# Patient Record
Sex: Male | Born: 1990 | Race: White | Hispanic: No | Marital: Single | Smoking: Never smoker
Health system: Southern US, Community
[De-identification: ages and names within clinical notes are randomized; demographics above are authoritative.]

## PROBLEM LIST (undated history)

## (undated) DIAGNOSIS — J45909 Unspecified asthma, uncomplicated: Secondary | ICD-10-CM

## (undated) DIAGNOSIS — Z22322 Carrier or suspected carrier of Methicillin resistant Staphylococcus aureus: Secondary | ICD-10-CM

## (undated) DIAGNOSIS — I890 Lymphedema, not elsewhere classified: Secondary | ICD-10-CM

## (undated) DIAGNOSIS — R569 Unspecified convulsions: Secondary | ICD-10-CM

## (undated) DIAGNOSIS — L0390 Cellulitis, unspecified: Secondary | ICD-10-CM

## (undated) DIAGNOSIS — F410 Panic disorder [episodic paroxysmal anxiety] without agoraphobia: Secondary | ICD-10-CM

## (undated) DIAGNOSIS — I809 Phlebitis and thrombophlebitis of unspecified site: Secondary | ICD-10-CM

## (undated) HISTORY — PX: TONSILLECTOMY: SUR1361

## (undated) HISTORY — PX: CATARACT EXTRACTION: SUR2

## (undated) HISTORY — PX: TONSILLECTOMY AND ADENOIDECTOMY: SHX28

---
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
## 2009-03-16 ENCOUNTER — Emergency Department: Admit: 2009-03-16 | Payer: Self-pay | Source: Emergency Department

## 2009-07-29 ENCOUNTER — Emergency Department: Admit: 2009-07-29 | Payer: Self-pay | Source: Emergency Department | Admitting: Emergency Medicine

## 2011-09-21 ENCOUNTER — Emergency Department
Admission: EM | Admit: 2011-09-21 | Discharge: 2011-09-21 | Disposition: A | Discharge status: Home / Self Care | Payer: BC Managed Care – PPO

## 2011-09-21 ENCOUNTER — Emergency Department: Payer: BC Managed Care – PPO

## 2011-09-21 DIAGNOSIS — Z8614 Personal history of Methicillin resistant Staphylococcus aureus infection: Secondary | ICD-10-CM | POA: Insufficient documentation

## 2011-09-21 DIAGNOSIS — L0291 Cutaneous abscess, unspecified: Secondary | ICD-10-CM

## 2011-09-21 DIAGNOSIS — L02419 Cutaneous abscess of limb, unspecified: Secondary | ICD-10-CM | POA: Insufficient documentation

## 2011-09-21 DIAGNOSIS — L03119 Cellulitis of unspecified part of limb: Secondary | ICD-10-CM | POA: Insufficient documentation

## 2011-09-21 HISTORY — DX: Lymphedema, not elsewhere classified: I89.0

## 2011-09-21 HISTORY — DX: Unspecified asthma, uncomplicated: J45.909

## 2011-09-21 MED ORDER — HYDROCODONE-ACETAMINOPHEN 5-500 MG PO TABS
1.0000 | ORAL_TABLET | Freq: Three times a day (TID) | ORAL | Status: AC | PRN
Start: 2011-09-21 — End: ?

## 2011-09-21 MED ORDER — DOXYCYCLINE HYCLATE 100 MG PO TABS
100.00 mg | ORAL_TABLET | Freq: Two times a day (BID) | ORAL | Status: AC
Start: 2011-09-21 — End: 2011-09-24

## 2011-09-21 NOTE — Discharge Instructions (Signed)
Abscess    You were diagnosed with an abscess of the:   Skin.    An abscess is a sore that is infected and filled with pus. It is caused by an infection with bacteria. Sometimes a splinter or other object stuck in the skin can cause an infection that can become an abscess. The body traps the infection in a tight pocket to try to stop the infection from spreading to other areas. The usual treatment is to make a cut in the abscess so the pus can drain out. Most abscesses heal quickly with no need for antibiotics.    Your doctor has determined that antibiotics ARE necessary to treat your abscess. Fill the prescription and take all medications as prescribed until they are all gone.    A drain and/or packing have been placed in your abscess to help the wound heal. The packing in your wound will need to be changed. This can be done by your family doctor, a referral doctor, this facility or the nearest Emergency Department. Your physician will set up a plan for you to have the packing changed. Leave the drain and packing in place until you see a doctor. The drain might fall out on it's own. If this happens, cover the abscess with a clean dressing (bandage) and follow up with a doctor as scheduled. Until the packing is removed, don't soak the wound in water, like in a bathtub or pool. Short showers or sponge baths are okay. Keep the wound as dry and clean as possible.    YOU SHOULD SEEK MEDICAL ATTENTION IMMEDIATELY, EITHER HERE OR AT THE NEAREST EMERGENCY DEPARTMENT, IF ANY OF THE FOLLOWING OCCURS:   You see unusual redness or swelling.   You see red streaks on the arm or leg.   The wound or drainage smells bad.   You have fevers, chills, worse pain or swelling.

## 2011-09-21 NOTE — ED Provider Notes (Signed)
History   No chief complaint on file.    Patient is a 21 y.o. male presenting with abscess.   Abscess   The problem occurs occasionally. The problem has been unchanged. The abscess is present on the left upper leg. The problem is mild. The abscess is characterized by redness and draining (recurrent / has hx mrsa).       No past medical history on file.    No past surgical history on file.    No family history on file.    No current facility-administered medications for this encounter.     No current outpatient prescriptions on file.       Allergies not on file    History   Substance Use Topics   . Smoking status: Not on file   . Smokeless tobacco: Not on file   . Alcohol Use: Not on file       Review of Systems   Skin:        [Thigh abscess / draining  [all other systems reviewed and are negative        Physical Exam   There were no vitals taken for this visit.    Physical Exam   [nursing notereviewed.  Constitutional: He is oriented to person, place, and time. He appears well-developed and well-nourished.   Pulmonary/Chest: Wheezes: lt inner thigh small / draining abscess.   Musculoskeletal: He exhibits edema.   Neurological: He is alert and oriented to person, place, and time.   Skin: Skin is warm. There is erythema.        Lt inner thigh / draining abscess       ED Course   Procedures    MDM           Ok Anis, MD  09/21/11 1415

## 2011-09-21 NOTE — ED Notes (Signed)
Right inner thigh wound culture obtained by dr. Roseanne Kaufman and sent to lab

## 2011-09-21 NOTE — ED Notes (Signed)
History of MRSA was on Bactrim ineffective, onset 4 days ago found had right inner thigh boil infected, pus drainage, no fever

## 2011-09-24 NOTE — Progress Notes (Signed)
Quick Note:    Final Wound Culture - positive MRSA. Chart reviewed by Dr. Nedra Hai. Pt on appropriate antibiotic therapy. No further action required.  ______

## 2011-12-19 ENCOUNTER — Emergency Department
Admission: EM | Admit: 2011-12-19 | Discharge: 2011-12-19 | Disposition: A | Discharge status: Home / Self Care | Payer: BC Managed Care – PPO | Attending: Emergency Medicine | Admitting: Emergency Medicine

## 2011-12-19 ENCOUNTER — Emergency Department: Payer: BC Managed Care – PPO

## 2011-12-19 DIAGNOSIS — J45909 Unspecified asthma, uncomplicated: Secondary | ICD-10-CM | POA: Insufficient documentation

## 2011-12-19 DIAGNOSIS — L03116 Cellulitis of left lower limb: Secondary | ICD-10-CM

## 2011-12-19 DIAGNOSIS — L03119 Cellulitis of unspecified part of limb: Secondary | ICD-10-CM | POA: Insufficient documentation

## 2011-12-19 DIAGNOSIS — Z8614 Personal history of Methicillin resistant Staphylococcus aureus infection: Secondary | ICD-10-CM | POA: Insufficient documentation

## 2011-12-19 HISTORY — DX: Carrier or suspected carrier of methicillin resistant Staphylococcus aureus: Z22.322

## 2011-12-19 MED ORDER — SULFAMETHOXAZOLE-TRIMETHOPRIM 800-160 MG PO TABS
1.00 | ORAL_TABLET | Freq: Two times a day (BID) | ORAL | Status: AC
Start: 2011-12-19 — End: 2011-12-26

## 2011-12-19 NOTE — ED Notes (Signed)
Discharge instructions  Given to pt.with prescriptions.

## 2011-12-19 NOTE — ED Provider Notes (Signed)
Diagnosis:    1. Cellulitis of left foot          Disposition:  home          _________________________________  HPI:   Marcus Pineda is a 21 y.o. male presents with left foot erythema. Pt with hx of MRSA and chronic lymphedema and recurrent cellulitis. Pt denies fever    PMD:    ROS:    Documented in HPI.   Nursing notes reveiwed  Past Medical and Surgical History Reviewed      PE:  AF/VSR  CONSTITUTIONAL: Patient is afebrile, Vital signs reviewed, Alert and oriented X 3.   HEAD: Atraumatic, Normocephalic.   EYES: Eyes are normal to inspection, Pupils equal, round and reactive to light, Extraocular muscles intact.   ENT: Ears normal to inspection, Nose examination normal.   NECK: Normal ROM, No jugular venous distention.   BACK: There is no CVA Tenderness, There is no tenderness to palpation.   UPPER EXTREMITY: Inspection normal, No cyanosis, Shoulder exam shows.   LOWER EXTREMITY: Inspection normal, No cyanosis.   NEURO: GCS is 15, No focal motor deficits.   SKIN: dorsal left foot erythema, mild edema 4x4 cm area.   PSYCHIATRIC: Oriented X 3, Normal affect.            MDM: abx and f/u in 2 days      Georgina Peer, MD  _________________________________        No data found.      Past Medical History   Diagnosis Date   . Lymphatic edema    . Asthma without status asthmaticus    . Methicillin resistant Staph aureus culture positive        Past Surgical History   Procedure Date   . Tonsillectomy        No family history on file.      Labs:  Results     ** No Results found for the last 24 hours. Shara Blazing, MD  12/19/11 2203

## 2011-12-19 NOTE — ED Notes (Signed)
Presents with left foot  Cellulitis starting this evening

## 2011-12-19 NOTE — Discharge Instructions (Signed)
Cellulitis    You have been diagnosed with cellulitis.    This is a bacterial infection of the skin. Symptoms usually include redness, swelling, and warmth in the affected area. Some people will have a fever with this infection.    Elevate the extremity above your heart level if possible.    Treatment of cellulitis includes antibiotics and elevation of the affected area. Sometimes the antibiotics need to be given intravenously ("IV") while other infections can be treated with oral (by mouth) medications.    The redness, swelling, warmth, and fever should start to improve after 2-3 days of treatment. You should return here or go to the nearest Emergency Department, or see your primary care doctor for a recheck as directed.    Return here or go to the nearest Emergency Department in 48 hours for another examination.    YOU SHOULD SEEK MEDICAL ATTENTION IMMEDIATELY, EITHER HERE OR AT THE NEAREST EMERGENCY DEPARTMENT, IF ANY OF THE FOLLOWING OCCURS:   Spreading redness even with treatment. You may wish to mark the area of infection with a pen to better watch for improvement or spreading.   Increasing or continued fever after 2-3 days of antibiotics.   Unusual or increasing pain at the site of the infection.   Lightheadedness.   If feeling sicker at any time, or if not improving as expected.      [facility] Referral    [facility] Referral     You have been referred to the [facility] for your follow-up care. Please contact them for assistance at [phone] or [phone].  You can also find them online at:  [email or web address]

## 2012-07-12 ENCOUNTER — Emergency Department: Payer: BC Managed Care – PPO

## 2012-07-12 ENCOUNTER — Emergency Department
Admission: EM | Admit: 2012-07-12 | Discharge: 2012-07-12 | Disposition: A | Discharge status: Home / Self Care | Payer: BC Managed Care – PPO

## 2012-07-12 DIAGNOSIS — J45909 Unspecified asthma, uncomplicated: Secondary | ICD-10-CM | POA: Insufficient documentation

## 2012-07-12 DIAGNOSIS — L02419 Cutaneous abscess of limb, unspecified: Secondary | ICD-10-CM | POA: Insufficient documentation

## 2012-07-12 DIAGNOSIS — Z8614 Personal history of Methicillin resistant Staphylococcus aureus infection: Secondary | ICD-10-CM | POA: Insufficient documentation

## 2012-07-12 MED ORDER — SULFAMETHOXAZOLE-TRIMETHOPRIM 800-160 MG PO TABS
1.00 | ORAL_TABLET | Freq: Two times a day (BID) | ORAL | Status: AC
Start: 2012-07-12 — End: 2012-07-19

## 2012-07-12 NOTE — ED Notes (Signed)
Noticed 3 days ago cellulitis to both legs  Left worse than right  Patient prone to this due to lymphadema

## 2012-07-12 NOTE — ED Provider Notes (Signed)
Physician/Midlevel provider first contact with patient: 07/12/12 1414         History     Chief Complaint   Patient presents with   . Rash     Patient is a 21 y.o. male presenting with rash. The history is provided by the patient.   Rash   This is a recurrent problem. The problem has been gradually worsening (hx of mrsa). Associated with: lymphedema. There has been no fever. The rash is present on the left lower leg. The pain is at a severity of 3/10.       Past Medical History   Diagnosis Date   . Lymphatic edema    . Asthma without status asthmaticus    . Methicillin resistant Staph aureus culture positive        Past Surgical History   Procedure Date   . Tonsillectomy        No family history on file.    Social  History   Substance Use Topics   . Smoking status: Never Smoker    . Smokeless tobacco: Not on file   . Alcohol Use: Yes      Comment: occassionally       .     Allergies   Allergen Reactions   . Dicloxacillin Rash       Current/Home Medications    HYDROCODONE-ACETAMINOPHEN (VICODIN) 5-500 MG PER TABLET    Take 1 tablet by mouth every 8 (eight) hours as needed for Pain.        Review of Systems   Skin: Positive for rash.        l lower leg red swelling   All other systems reviewed and are negative.        Physical Exam    BP 114/68  Pulse 65  Temp 96.8 F (36 C) (Oral)  Resp 16  Ht 1.6 m  Wt 70.308 kg  BMI 27.46 kg/m2  SpO2 98%    Physical Exam   Nursing note and vitals reviewed.  Constitutional: He is oriented to person, place, and time. He appears well-developed and well-nourished.   Musculoskeletal: Normal range of motion. He exhibits edema and tenderness.        Lt lower leg redness / swelling   Neurological: He is alert and oriented to person, place, and time.   Skin: Skin is warm. There is erythema.        l lower leg swelling redness / hx lymphedema       MDM and ED Course     ED Medication Orders     None           MDM      Procedures    Clinical Impression & Disposition     Clinical  Impression  Final diagnoses:   None        ED Disposition     None           New Prescriptions    No medications on file               Ok Anis, MD  07/13/12 [id number]

## 2012-07-12 NOTE — Discharge Instructions (Signed)
Cellulitis  You have an infection of the skin known as cellulitis. This usually starts with a scrape, cut, insect bite, blister or other opening in the skin which becomes infected. This is a serious condition. It must be watched closely to be sure the infection is not spreading.  With antibiotic treatment, the size of the red area will gradually shrink in size until the skin returns to normal. This will take 7-10 days.  The red area should never increase in size once the antibiotic medicine has been started. Occasionally, an infection will be resistant to one antibiotic and another one will have to be used.  Home Care:  1) Limit the use of the affected part, since excess movement can cause the infection to spread.  2) If the infection is on your leg, walk as little as possible during the first few days of the treatment. Keep your leg elevated while sitting. This will reduce swelling.  3) Take all of the antibiotic medicine exactly as directed until it is gone. Be careful not to miss any doses, especially during the first seven days.  Follow Up  with your doctor or this facility as directed. Check the infected area daily for the warning signs listed below.  Get Prompt Medical Attention  if any of the following occur:  -- Spreading area of redness  -- Increasing swelling or pain  -- Appearance of pus or drainage  -- Fever over 100.4 F (38.0 C) oral, or over 101.4 F (38.6 C) rectal, after two days on antibiotics   2000-2013 [employer], [address], [place] [address]. All rights reserved. This information is not intended as a substitute for professional medical care. Always follow your healthcare professional's instructions.

## 2015-03-01 ENCOUNTER — Ambulatory Visit: Payer: No Typology Code available for payment source | Attending: Internal Medicine

## 2015-03-01 DIAGNOSIS — Z01818 Encounter for other preprocedural examination: Secondary | ICD-10-CM | POA: Insufficient documentation

## 2015-03-08 NOTE — Pre-Procedure Instructions (Signed)
Received request from Dr. Penni Bombard for 02/2015 MRSA reports, faxed same to [phone].

## 2015-06-03 ENCOUNTER — Ambulatory Visit (INDEPENDENT_AMBULATORY_CARE_PROVIDER_SITE_OTHER): Payer: No Typology Code available for payment source | Admitting: Family Medicine

## 2015-07-19 ENCOUNTER — Other Ambulatory Visit: Payer: Self-pay | Admitting: Internal Medicine

## 2019-04-12 ENCOUNTER — Emergency Department (HOSPITAL_BASED_OUTPATIENT_CLINIC_OR_DEPARTMENT_OTHER): Payer: Self-pay

## 2019-04-12 ENCOUNTER — Other Ambulatory Visit: Payer: Self-pay

## 2019-04-12 ENCOUNTER — Encounter (HOSPITAL_BASED_OUTPATIENT_CLINIC_OR_DEPARTMENT_OTHER): Payer: Self-pay | Admitting: Emergency Medicine

## 2019-04-12 ENCOUNTER — Emergency Department (HOSPITAL_BASED_OUTPATIENT_CLINIC_OR_DEPARTMENT_OTHER)
Admission: EM | Admit: 2019-04-12 | Discharge: 2019-04-12 | Disposition: A | Discharge status: Home / Self Care | Payer: Self-pay | Attending: Emergency Medicine | Admitting: Emergency Medicine

## 2019-04-12 DIAGNOSIS — R569 Unspecified convulsions: Secondary | ICD-10-CM | POA: Insufficient documentation

## 2019-04-12 HISTORY — DX: Cellulitis, unspecified: L03.90

## 2019-04-12 HISTORY — DX: Phlebitis and thrombophlebitis of unspecified site: I80.9

## 2019-04-12 HISTORY — DX: Lymphedema, not elsewhere classified: I89.0

## 2019-04-12 HISTORY — DX: Panic disorder (episodic paroxysmal anxiety): F41.0

## 2019-04-12 LAB — CBC WITH DIFFERENTIAL/PLATELET
Abs Immature Granulocytes: 0.07 10*3/uL (ref 0.00–0.07)
Basophils Absolute: 0.1 10*3/uL (ref 0.0–0.1)
Basophils Relative: 1 %
Eosinophils Absolute: 0.1 10*3/uL (ref 0.0–0.5)
Eosinophils Relative: 1 %
HCT: 44.2 % (ref 39.0–52.0)
Hemoglobin: 14.6 g/dL (ref 13.0–17.0)
Immature Granulocytes: 1 %
Lymphocytes Relative: 21 %
Lymphs Abs: 2.2 10*3/uL (ref 0.7–4.0)
MCH: 28.8 pg (ref 26.0–34.0)
MCHC: 33 g/dL (ref 30.0–36.0)
MCV: 87.2 fL (ref 80.0–100.0)
Monocytes Absolute: 1.1 10*3/uL — ABNORMAL HIGH (ref 0.1–1.0)
Monocytes Relative: 11 %
Neutro Abs: 7.1 10*3/uL (ref 1.7–7.7)
Neutrophils Relative %: 65 %
Platelets: 327 10*3/uL (ref 150–400)
RBC: 5.07 MIL/uL (ref 4.22–5.81)
RDW: 12.2 % (ref 11.5–15.5)
WBC: 10.8 10*3/uL — ABNORMAL HIGH (ref 4.0–10.5)
nRBC: 0 % (ref 0.0–0.2)

## 2019-04-12 LAB — BASIC METABOLIC PANEL
Anion gap: 9 (ref 5–15)
BUN: 18 mg/dL (ref 6–20)
CO2: 26 mmol/L (ref 22–32)
Calcium: 9.1 mg/dL (ref 8.9–10.3)
Chloride: 102 mmol/L (ref 98–111)
Creatinine, Ser: 0.94 mg/dL (ref 0.61–1.24)
GFR calc Af Amer: >60 mL/min (ref >60)
GFR calc non Af Amer: >60 mL/min (ref >60)
Glucose, Bld: 103 mg/dL — ABNORMAL HIGH (ref 70–99)
Potassium: 4 mmol/L (ref 3.5–5.1)
Sodium: 137 mmol/L (ref 135–145)

## 2019-04-12 LAB — CBG MONITORING, ED: Glucose-Capillary: 99 mg/dL (ref 70–99)

## 2019-04-12 NOTE — Discharge Instructions (Addendum)
Recommend scheduling follow-up appointment both with a primary care doctor as well as a neurologist.  If in the meantime you develop any further episodes of seizures, numbness, weakness, vision changes, difficulty breathing or other new concerning symptom please return to ER for reassessment.

## 2019-04-12 NOTE — ED Provider Notes (Addendum)
[facility] EMERGENCY DEPARTMENT Provider Note   CSN: [id number] Arrival date & time: 04/12/19  0806     History   Chief Complaint Chief Complaint  Patient presents with  . Seizures    HPI Charles Beck is a 28 y.o. male.  Presents emerged department after seizure episode.  Patient states does not remember episode, history of episode provided by wife.  Wife states patient woke up around 5AM and was screaming, then went unresponsive and had full body shaking.  Unsure how long it lasted, felt like several minutes.  Had urinary incontinence, then had period of confusion after the episode stopped.  No tongue biting, lip biting, bowel incontinence.  Patient states last night when he went to bed thought he had a panic attack felt very anxious and had some shaking but did not have any loss of consciousness.  Reports that he has panic attacks frequently but has not ever had full body seizure-like activity with loss of consciousness previously.  No recent illnesses.  Currently is asymptomatic denies any numbness, weakness, vision changes, gait changes.    HPI  Past Medical History:  Diagnosis Date  . Anxiety attack   . Cellulitis   . Lymphedema   . Phlebitis     There are no active problems to display for this patient.   Past Surgical History:  Procedure Laterality Date  . TONSILLECTOMY          Home Medications    Prior to Admission medications   Not on File    Family History No family history on file.  Social History Social History   Tobacco Use  . Smoking status: Never Smoker  . Smokeless tobacco: Never Used  Substance Use Topics  . Alcohol use: Not Currently  . Drug use: Never     Allergies   Patient has no known allergies.   Review of Systems Review of Systems  Constitutional: Negative for chills and fever.  HENT: Negative for ear pain and sore throat.   Eyes: Negative for pain and visual disturbance.  Respiratory: Negative for cough and  shortness of breath.   Cardiovascular: Negative for chest pain and palpitations.  Gastrointestinal: Negative for abdominal pain and vomiting.  Genitourinary: Negative for dysuria and hematuria.  Musculoskeletal: Negative for arthralgias and back pain.  Skin: Negative for color change and rash.  Neurological: Positive for seizures. Negative for syncope.  All other systems reviewed and are negative.    Physical Exam Updated Vital Signs BP 118/76 (BP Location: Right Arm)   Pulse 72   Temp 98.6 F (37 C) (Oral)   Resp (!) 22   Ht 5\' 3"  (1.6 m)   Wt 72.6 kg   SpO2 98%   BMI 28.34 kg/m   Physical Exam Vitals signs and nursing note reviewed.  Constitutional:      Appearance: He is well-developed.  HENT:     Head: Normocephalic and atraumatic.  Eyes:     Conjunctiva/sclera: Conjunctivae normal.  Neck:     Musculoskeletal: Neck supple.  Cardiovascular:     Rate and Rhythm: Normal rate and regular rhythm.     Heart sounds: No murmur.  Pulmonary:     Effort: Pulmonary effort is normal. No respiratory distress.     Breath sounds: Normal breath sounds.  Abdominal:     Palpations: Abdomen is soft.     Tenderness: There is no abdominal tenderness.  Skin:    General: Skin is warm and dry.  Neurological:  Mental Status: He is alert.     Comments: Oriented x3, cranial nerves II through XII intact, normal pronator drift, normal finger-nose-finger, 5 out of 5 strength in bilateral upper and lower extremities, sensation to light touch intact in all 4 extremities      ED Treatments / Results  Labs (all labs ordered are listed, but only abnormal results are displayed) Labs Reviewed  CBC WITH DIFFERENTIAL/PLATELET - Abnormal; Notable for the following components:      Result Value   WBC 10.8 (*)    Monocytes Absolute 1.1 (*)    All other components within normal limits  BASIC METABOLIC PANEL - Abnormal; Notable for the following components:   Glucose, Bld 103 (*)    All  other components within normal limits  CBG MONITORING, ED    EKG None  Radiology Ct Head Wo Contrast  Result Date: 04/12/2019 CLINICAL DATA:  Per his wife, he was asleep and screamed around 5am followed by shaking. She states he was not alert for several minutes. He was incontinent of urine. No hx of seizures Pt states he has lymphatic problems, pt complains he is weak and has a frontal area headache. EXAM: CT HEAD WITHOUT CONTRAST TECHNIQUE: Contiguous axial images were obtained from the base of the skull through the vertex without intravenous contrast. COMPARISON:  None. FINDINGS: Brain: No evidence of acute infarction, hemorrhage, hydrocephalus, extra-axial collection or mass lesion/mass effect. Vascular: No hyperdense vessel or unexpected calcification. Skull: Normal. Negative for fracture or focal lesion. Sinuses/Orbits: No acute finding. Other: None. IMPRESSION: Negative exam. Electronically Signed   By: Nolon Nations M.D.   On: 04/12/2019 09:24    Procedures Procedures (including critical care time)  Medications Ordered in ED Medications - No data to display   Initial Impression / Assessment and Plan / ED Course  I have reviewed the triage vital signs and the nursing notes.  Pertinent labs & imaging results that were available during my care of the patient were reviewed by me and considered in my medical decision making (see chart for details).        28 year old male presents after first seizure episode.  Here, no additional seizures or seizure-like episodes.  Currently asymptomatic.  CT head negative, labs negative. 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  Believe he is appropriate for further management as outpatient. Recommended close recheck with a PCP and neurologist for consideration for outpatient EEG, MRI testing.  Patient agreeable. Does not have PCP locally but has insurance and seems motivated and reliable to  follow through with outpatient follow up.  Provided info for PCP as well as neurology.  Given strict return precautions, will discharge home at this time.  After the discussed management above, the patient was determined to be safe for discharge.  The patient was in agreement with this plan and all questions regarding their care were answered.  ED return precautions were discussed and the patient will return to the ED with any significant worsening of condition.   Final Clinical Impressions(s) / ED Diagnoses   Final diagnoses:  Witnessed seizure-like activity [facility])    ED Discharge Orders    None       Lucrezia Starch, MD 04/12/19 [id number]    Lucrezia Starch, MD 04/12/19 [phone]

## 2019-04-12 NOTE — ED Triage Notes (Signed)
Per his wife, he was asleep and screamed around 5am followed by shaking. She states he was not alert for several minutes. He was incontinent of urine. No hx of seizures.

## 2019-04-12 NOTE — ED Notes (Signed)
Ambulated to BR, gait steady 

## 2019-07-12 ENCOUNTER — Emergency Department (HOSPITAL_COMMUNITY)
Admission: EM | Admit: 2019-07-12 | Discharge: 2019-07-12 | Disposition: A | Discharge status: Home / Self Care | Payer: Self-pay | Attending: Emergency Medicine | Admitting: Emergency Medicine

## 2019-07-12 ENCOUNTER — Encounter (HOSPITAL_COMMUNITY): Payer: Self-pay | Admitting: Emergency Medicine

## 2019-07-12 ENCOUNTER — Emergency Department (HOSPITAL_COMMUNITY): Payer: Self-pay

## 2019-07-12 ENCOUNTER — Other Ambulatory Visit: Payer: Self-pay

## 2019-07-12 DIAGNOSIS — Z79899 Other long term (current) drug therapy: Secondary | ICD-10-CM | POA: Insufficient documentation

## 2019-07-12 DIAGNOSIS — R569 Unspecified convulsions: Secondary | ICD-10-CM | POA: Insufficient documentation

## 2019-07-12 HISTORY — DX: Unspecified convulsions: R56.9

## 2019-07-12 LAB — BASIC METABOLIC PANEL
Anion gap: 9 (ref 5–15)
BUN: 16 mg/dL (ref 6–20)
CO2: 23 mmol/L (ref 22–32)
Calcium: 9.1 mg/dL (ref 8.9–10.3)
Chloride: 106 mmol/L (ref 98–111)
Creatinine, Ser: 1.03 mg/dL (ref 0.61–1.24)
GFR calc Af Amer: >60 mL/min (ref >60)
GFR calc non Af Amer: >60 mL/min (ref >60)
Glucose, Bld: 110 mg/dL — ABNORMAL HIGH (ref 70–99)
Potassium: 4 mmol/L (ref 3.5–5.1)
Sodium: 138 mmol/L (ref 135–145)

## 2019-07-12 LAB — CBC
HCT: 45.8 % (ref 39.0–52.0)
Hemoglobin: 15.4 g/dL (ref 13.0–17.0)
MCH: 29.7 pg (ref 26.0–34.0)
MCHC: 33.6 g/dL (ref 30.0–36.0)
MCV: 88.4 fL (ref 80.0–100.0)
Platelets: 328 10*3/uL (ref 150–400)
RBC: 5.18 MIL/uL (ref 4.22–5.81)
RDW: 12.1 % (ref 11.5–15.5)
WBC: 9.2 10*3/uL (ref 4.0–10.5)
nRBC: 0 % (ref 0.0–0.2)

## 2019-07-12 LAB — CBG MONITORING, ED: Glucose-Capillary: 109 mg/dL — ABNORMAL HIGH (ref 70–99)

## 2019-07-12 LAB — TROPONIN I (HIGH SENSITIVITY): Troponin I (High Sensitivity): 5 ng/L (ref <18)

## 2019-07-12 MED ORDER — LEVETIRACETAM 500 MG PO TABS: 500.0000 mg | ORAL_TABLET | Freq: Two times a day (BID) | ORAL | 0 refills | Status: DC

## 2019-07-12 MED ORDER — SODIUM CHLORIDE 0.9 % IV BOLUS
1000.0000 mL | Freq: Once | INTRAVENOUS | Status: AC
  Administered 2019-07-12: 1000 mL via INTRAVENOUS

## 2019-07-12 MED ORDER — LEVETIRACETAM IN NACL 1000 MG/100ML IV SOLN
1000.0000 mg | Freq: Once | INTRAVENOUS | Status: AC
  Administered 2019-07-12: 1000 mg via INTRAVENOUS
  Filled 2019-07-12: qty 100

## 2019-07-12 NOTE — Progress Notes (Signed)
Patient refusing MRI per RN.

## 2019-07-12 NOTE — ED Triage Notes (Signed)
Pt in from home via [facility] after having 1 min long seizure after waking up this am. Hx of first seizure back in September, not on seizure meds. EMS states post-ictal x 15 min. Arrives A&ox4, GCS 15. Pt does reports body tenseness in chest, dizziness and fatigue.

## 2019-07-12 NOTE — Care Management (Signed)
ED CM noted patient to have had 2 ED visits within the past 6 months for similar complaints, no health insurance or PCP listed.  ED CM met with patient at bedside patient confirms information, discussed the [facility] and [place] and the services rendered, patient is agreeable. Patient provided permission to forward information to [facility] CM to arrange a f/u a[ppointment, patient also given clinic contact information to also make contact. Updated Dr. Billy Fischer, no further ED CM needs identified.

## 2019-07-12 NOTE — ED Provider Notes (Signed)
[facility] EMERGENCY DEPARTMENT Provider Note   CSN: [id number] Arrival date & time: 07/12/19  0805     History   Chief Complaint Chief Complaint  Patient presents with  . Seizures    HPI Charles Beck is a 28 y.o. male.     HPI 28 yo male presents with concern for seizure like activity.   Scream, then tensed up and had full body shaking for approximately 1 minute Urinary incontinence Thenb appeared sleepy for about 7 minutes Then was somewhat altered trying to cover self with blankets Then got up to get water, was stumbling, kept going back to the kitchen, repeating things, "saying I'm fine, stop", kept repeating questions Would try to say words and they would come out jumbled, lasted a few more minutes Is back to normal now On regular basis will have some "anxiety attacks" with some similar features, the memory loss/tensing 6 years ago began to develop episodes where he will zone out and lip smack or click tongue, he is not conscious of these episodes but wife notes them. He does note episodes where he has difficulty getting words out, feels physiologic thing then feels anxious after  2003 diagnosed with Noonan's Syndrome, chronic lymphedema, gets frequent cellulitis, no hx of structural heart disease   Hasn't been sleeping well because of children recently  Feels heavy pressure on the chest for a few days, has been fluctuating, worse at night when kids waking up   Past Medical History:  Diagnosis Date  . Anxiety attack   . Cellulitis   . Lymphedema   . Phlebitis   . Seizures ([place])     There are no active problems to display for this patient.   Past Surgical History:  Procedure Laterality Date  . TONSILLECTOMY          Home Medications    Prior to Admission medications   Medication Sig Start Date End Date Taking? Authorizing Provider  Multiple Vitamins-Minerals (MULTIVITAMIN WITH MINERALS) tablet Take 1 tablet by mouth daily.   Yes  [provider]  levETIRAcetam (KEPPRA) 500 MG tablet Take 1 tablet (500 mg total) by mouth 2 (two) times daily. 07/12/19 08/11/19  Gareth Morgan, MD    Family History No family history on file.  Social History Social History   Tobacco Use  . Smoking status: Never Smoker  . Smokeless tobacco: Never Used  Substance Use Topics  . Alcohol use: Not Currently  . Drug use: Never     Allergies   Doxycycline and Keflex [cephalexin]   Review of Systems Review of Systems  Constitutional: Positive for fatigue.  HENT: Negative for congestion.   Eyes: Negative for visual disturbance.  Respiratory: Negative for cough and shortness of breath.   Cardiovascular: Positive for chest pain.  Gastrointestinal: Positive for nausea. Negative for abdominal pain and vomiting.  Genitourinary: Negative for dysuria.  Neurological: Positive for dizziness, seizures, syncope, speech difficulty (had briefly), light-headedness and headaches (more than usual). Negative for facial asymmetry, weakness and numbness.     Physical Exam Updated Vital Signs BP 110/70   Pulse 84   Temp 98 F (36.7 C) (Oral)   Resp (!) 24   Wt 72.6 kg   SpO2 97%   BMI 28.35 kg/m   Physical Exam Vitals signs and nursing note reviewed.  Constitutional:      General: He is not in acute distress.    Appearance: He is well-developed. He is not diaphoretic.  HENT:  Head: Normocephalic and atraumatic.  Eyes:     Conjunctiva/sclera: Conjunctivae normal.  Neck:     Musculoskeletal: Normal range of motion.  Cardiovascular:     Rate and Rhythm: Normal rate and regular rhythm.     Heart sounds: Normal heart sounds. No murmur. No friction rub. No gallop.   Pulmonary:     Effort: Pulmonary effort is normal. No respiratory distress.     Breath sounds: Normal breath sounds. No wheezing or rales.  Abdominal:     General: There is no distension.     Palpations: Abdomen is soft.     Tenderness: There is no abdominal  tenderness. There is no guarding.  Skin:    General: Skin is warm and dry.  Neurological:     Mental Status: He is alert and oriented to person, place, and time.     GCS: GCS eye subscore is 4. GCS verbal subscore is 5. GCS motor subscore is 6.     Cranial Nerves: Cranial nerves are intact. No cranial nerve deficit, dysarthria or facial asymmetry.     Sensory: Sensation is intact. No sensory deficit.     Motor: Motor function is intact. No weakness, tremor or pronator drift.     Coordination: Coordination is intact. Finger-Nose-Finger Test normal.      ED Treatments / Results  Labs (all labs ordered are listed, but only abnormal results are displayed) Labs Reviewed  BASIC METABOLIC PANEL - Abnormal; Notable for the following components:      Result Value   Glucose, Bld 110 (*)    All other components within normal limits  CBG MONITORING, ED - Abnormal; Notable for the following components:   Glucose-Capillary 109 (*)    All other components within normal limits  CBC  CBG MONITORING, ED  TROPONIN I (HIGH SENSITIVITY)  TROPONIN I (HIGH SENSITIVITY)    EKG EKG Interpretation  Date/Time:  Sunday July 12 2019 08:13:01 EST Ventricular Rate:  111 PR Interval:    QRS Duration: 89 QT Interval:  318 QTC Calculation: 433 R Axis:   70 Text Interpretation: Sinus tachycardia Borderline T abnormalities, inferior leads Borderline ST elevation, lateral leads Similar TW changes since prior ECG, rate has increased, no other significant changes Confirmed by Yecheskel Kurek ([id number]) on 07/12/2019 9:21:41 AM   Radiology Dg Chest Portable 1 View  Result Date: 07/12/2019 CLINICAL DATA:  Chest pain.  Seizure. EXAM: PORTABLE CHEST 1 VIEW COMPARISON:  None. FINDINGS: The heart size and mediastinal contours are within normal limits. Both lungs are clear. The visualized skeletal structures are unremarkable. IMPRESSION: No active disease. Electronically Signed   By: John A Stahl M.D.   On:  07/12/2019 11:17    Procedures Procedures (including critical care time)  Medications Ordered in ED Medications  levETIRAcetam (KEPPRA) IVPB 1000 mg/100 mL premix (0 mg Intravenous Stopped 07/12/19 1110)  sodium chloride 0.9 % bolus 1,000 mL (0 mLs Intravenous Stopped 07/12/19 1316)     Initial Impression / Assessment and Plan / ED Course  I have reviewed the triage vital signs and the nursing notes.  Pertinent labs & imaging results that were available during my care of the patient were reviewed by me and considered in my medical decision making (see chart for details).        28 yo male with history of Noonan's syndrome with associated lymphedema, ED visit for seizure-like activity in September, presents with concern for seizure-like activity.  Episode described consistent with seizure with urinary incontinence, shaking, post ictal  period.  Had CT head done months ago without acute abnormalities. Neurologic exam without any focal abnormalities, no sign of CVA. History not consistent with SAH.  No history to suggest medication related or withdrawal seizure. No significant electrolyte abnormalities. No history of head trauma.  No sign of cardiac arrhythmia, history most consistent with seizure.  Also reports days of CP, EKG, CXR and troponin without acute abnormalities. Reports worsens when upset about children waking up, consider stress related and recommend PCP follow up.    Given keppra 1000mg  IV.  Did report some lightheadedness after administration, no signs of anaphylaxis, given IV fluids with improvement.    Given rx for keppra 500mg  BID, recommend follow up with Neurology, further outpatient testing. Discussed seizure precautions.     Final Clinical Impressions(s) / ED Diagnoses   Final diagnoses:  Seizure [facility])    ED Discharge Orders         Ordered    levETIRAcetam (KEPPRA) 500 MG tablet  2 times daily     07/12/19 1322           Gareth Morgan, MD 07/12/19 2106

## 2019-07-12 NOTE — ED Notes (Signed)
Pt hypotensive after IV Keppra, reports some dizziness. Laid back in stretcher, BP and dizziness improved

## 2019-07-12 NOTE — ED Notes (Signed)
Patient verbalizes understanding of discharge instructions. Opportunity for questioning and answers were provided. Armband removed by staff, pt discharged from ED. Ambulated out to lobby with wife  

## 2019-07-17 ENCOUNTER — Encounter: Payer: Self-pay | Admitting: Neurology

## 2019-09-28 ENCOUNTER — Encounter: Payer: Self-pay | Admitting: Neurology

## 2019-09-28 ENCOUNTER — Other Ambulatory Visit: Payer: Self-pay

## 2019-09-28 ENCOUNTER — Ambulatory Visit: Payer: Self-pay | Admitting: Neurology

## 2019-09-28 VITALS — BP 112/75 | HR 80 | Ht 63.0 in | Wt 164.2 lb

## 2019-09-28 DIAGNOSIS — G40009 Localization-related (focal) (partial) idiopathic epilepsy and epileptic syndromes with seizures of localized onset, not intractable, without status epilepticus: Secondary | ICD-10-CM

## 2019-09-28 MED ORDER — LEVETIRACETAM 500 MG PO TABS: ORAL_TABLET | ORAL | 11 refills | Status: DC
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
## 2019-11-20 ENCOUNTER — Telehealth: Payer: Self-pay | Admitting: Neurology

## 2019-11-20 NOTE — Telephone Encounter (Signed)
Patient called stating that Dr Delice Lesch suggested he get an MRI. He is wanting to get a referral. Please call.

## 2019-11-23 ENCOUNTER — Other Ambulatory Visit: Payer: Self-pay

## 2019-11-23 DIAGNOSIS — G40009 Localization-related (focal) (partial) idiopathic epilepsy and epileptic syndromes with seizures of localized onset, not intractable, without status epilepticus: Secondary | ICD-10-CM

## 2019-11-23 NOTE — Telephone Encounter (Signed)
Per Dr. Amparo Bristol note, she wanted to get MRI brain wwo contrast and EEG.  Please order.  Thanks.

## 2019-11-23 NOTE — Telephone Encounter (Signed)
Spoke with pt and let him know that the EEG and MRI both have been scheduled, and to be looking for a call from [facility] to get the MRI scheduled and our office for the EEG. Pt verbalized understanding

## 2019-11-23 NOTE — Telephone Encounter (Signed)
Pt called to inform that MRI was ordered and that [facility] imaging would call to get it scheduled no answer no voice mail set up,

## 2019-11-23 NOTE — Progress Notes (Signed)
No prior auth required for MRI Pt has medicaid family planning only

## 2019-12-31 ENCOUNTER — Ambulatory Visit: Payer: Medicaid Other | Admitting: Neurology

## 2020-01-05 ENCOUNTER — Telehealth: Payer: Self-pay | Admitting: Neurology

## 2020-01-05 ENCOUNTER — Other Ambulatory Visit: Payer: Self-pay

## 2020-01-05 MED ORDER — LEVETIRACETAM 500 MG PO TABS: ORAL_TABLET | ORAL | 11 refills | Status: DC

## 2020-01-05 NOTE — Telephone Encounter (Signed)
Pt called informed that script for keppra was sent in for him

## 2020-01-05 NOTE — Telephone Encounter (Signed)
Patient needs refill of Keppra sent to [facility] on [address] [employer].

## 2020-03-13 ENCOUNTER — Emergency Department (HOSPITAL_COMMUNITY): Payer: Self-pay

## 2020-03-13 ENCOUNTER — Other Ambulatory Visit: Payer: Self-pay

## 2020-03-13 ENCOUNTER — Emergency Department (HOSPITAL_COMMUNITY)
Admission: EM | Admit: 2020-03-13 | Discharge: 2020-03-13 | Disposition: A | Discharge status: Home / Self Care | Payer: Self-pay | Attending: Emergency Medicine | Admitting: Emergency Medicine

## 2020-03-13 DIAGNOSIS — Z91199 Patient's noncompliance with other medical treatment and regimen due to unspecified reason: Secondary | ICD-10-CM

## 2020-03-13 DIAGNOSIS — Z9119 Patient's noncompliance with other medical treatment and regimen: Secondary | ICD-10-CM | POA: Insufficient documentation

## 2020-03-13 DIAGNOSIS — R519 Headache, unspecified: Secondary | ICD-10-CM | POA: Insufficient documentation

## 2020-03-13 DIAGNOSIS — R569 Unspecified convulsions: Secondary | ICD-10-CM | POA: Insufficient documentation

## 2020-03-13 DIAGNOSIS — M542 Cervicalgia: Secondary | ICD-10-CM | POA: Insufficient documentation

## 2020-03-13 NOTE — ED Triage Notes (Signed)
Patient had grand mal seizure, came back to consciousness walking in the road, escorted back to home via PD. Patient states previous episodes of seizures that only happen at night. Feels as though he is going to have another seizure. Patient takes [place]. Patient has chronic lymphedema, patient noticed swelling in neck and head, recommended to have MRI but has not had one done. Patient is ambulatory without assistance, AxOx4.

## 2020-03-13 NOTE — ED Provider Notes (Signed)
[facility] DEPT Provider Note   CSN: [id number] Arrival date & time: 03/13/20  0300     History Chief Complaint  Patient presents with  . Seizures    Charles Beck is a 29 y.o. male with a past medical history of Noonan syndrome, anxiety, seizures, lymphedema who presents today for evaluation after a possible seizure. History is obtained from patient and chart review. Patient reports that he was staying at an [place] and had what he believes was a grand mal seizure. He does not remember this however states that normally after his seizures he will wander inside the house. He states that he came back to awareness when he was walking in the road in his boxers. He reports that his seizures only happen at night so his neurologist has cleared him to drive during the day, not at night. He takes Keppra, however states that instead of taking it twice a day like directed he is only taking it at night and the he has missed the past 2 doses. He states that after his seizure he was able to walk back to the [employer] where the police then escorted him to a hotel. He states that when he went to fall asleep he had a minute of pain in the left side of his neck that went away however he is concerned that he may have another seizure causing him to check in. He reports a posterior headache. He denies any fevers, no recent sickness or illness.  HPI     Past Medical History:  Diagnosis Date  . Anxiety attack   . Cellulitis   . Lymphedema   . Phlebitis   . Seizures ([place])     There are no problems to display for this patient.   Past Surgical History:  Procedure Laterality Date  . TONSILLECTOMY         Family History  Problem Relation Age of Onset  . Jaundice Mother     Social History   Tobacco Use  . Smoking status: Never Smoker  . Smokeless tobacco: Never Used  Substance Use Topics  . Alcohol use: Not Currently  . Drug use: Never    Home Medications Prior to  Admission medications   Medication Sig Start Date End Date Taking? Authorizing Provider  levETIRAcetam (KEPPRA) 500 MG tablet Take 1 tablet twice a day 01/05/20   Cameron Sprang, MD  Multiple Vitamins-Minerals (MULTIVITAMIN WITH MINERALS) tablet Take 1 tablet by mouth daily.    [provider]    Allergies    Doxycycline and Keflex [cephalexin]  Review of Systems   Review of Systems  Constitutional: Negative for chills and fever.  Respiratory: Negative for chest tightness and shortness of breath.   Cardiovascular: Negative for chest pain.  Gastrointestinal: Negative for abdominal pain.  Musculoskeletal: Positive for neck pain (Had earlier now resolved). Negative for back pain.  Skin: Negative for color change, rash and wound.  Neurological: Positive for seizures and headaches.  Psychiatric/Behavioral: The patient is nervous/anxious.     Physical Exam Updated Vital Signs BP 130/83 (BP Location: Right Arm)   Pulse 98   Temp 98.1 F (36.7 C) (Oral)   Resp 18   Ht 5\' 3"  (1.6 m)   Wt 74.4 kg   SpO2 97%   BMI 29.05 kg/m   Physical Exam Vitals and nursing note reviewed.  Constitutional:      General: He is not in acute distress.    Appearance: He is well-developed.  He is not diaphoretic.  HENT:     Head: Normocephalic and atraumatic.     Comments: Mild pain over the posterior head, worse with palpation.     Mouth/Throat:     Mouth: Mucous membranes are moist.  Eyes:     General: No scleral icterus.       Right eye: No discharge.        Left eye: No discharge.     Conjunctiva/sclera: Conjunctivae normal.  Cardiovascular:     Rate and Rhythm: Normal rate and regular rhythm.     Pulses: Normal pulses.  Pulmonary:     Effort: Pulmonary effort is normal. No respiratory distress.     Breath sounds: No stridor.  Abdominal:     General: There is no distension.     Tenderness: There is no abdominal tenderness. There is no guarding.  Musculoskeletal:        General:  No deformity.     Cervical back: Normal range of motion and neck supple. No rigidity or tenderness.  Skin:    General: Skin is warm and dry.  Neurological:     General: No focal deficit present.     Mental Status: He is alert. Mental status is at baseline.     Motor: No abnormal muscle tone.     Comments: Patient is awake and alert. He is oriented to person, place, and time. Speech is not slurred. Facial movements are symmetric. 5/5 strength bilateral upper and lower extremities.  Psychiatric:        Mood and Affect: Mood normal.        Behavior: Behavior normal.     ED Results / Procedures / Treatments   Labs (all labs ordered are listed, but only abnormal results are displayed) Labs Reviewed - No data to display  EKG None  Radiology CT Head Wo Contrast  Result Date: 03/13/2020 CLINICAL DATA:  Seizure EXAM: CT HEAD WITHOUT CONTRAST TECHNIQUE: Contiguous axial images were obtained from the base of the skull through the vertex without intravenous contrast. COMPARISON:  04/12/2019 FINDINGS: Brain: There is no mass, hemorrhage or extra-axial collection. The size and configuration of the ventricles and extra-axial CSF spaces are normal. The brain parenchyma is normal, without acute or chronic infarction. Vascular: No abnormal hyperdensity of the major intracranial arteries or dural venous sinuses. No intracranial atherosclerosis. Skull: The visualized skull base, calvarium and extracranial soft tissues are normal. Sinuses/Orbits: No fluid levels or advanced mucosal thickening of the visualized paranasal sinuses. No mastoid or middle ear effusion. The orbits are normal. IMPRESSION: Normal head CT. Electronically Signed   By: Ulyses Jarred M.D.   On: 03/13/2020 05:14    Procedures Procedures (including critical care time)  Medications Ordered in ED Medications - No data to display  ED Course  I have reviewed the triage vital signs and the nursing notes.  Pertinent labs & imaging  results that were available during my care of the patient were reviewed by me and considered in my medical decision making (see chart for details).    MDM Rules/Calculators/A&P                         Charles Beck is a 29 year old man with a past medical history of Noonan syndrome who presents today for evaluation of a suspected seizure. 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 He was reportedly wandering on the road when he came  to tonight, suspect that he may have had a grand mal seizure. He took his p.o. Keppra, that he had missed 2 doses of, after the seizure. He reports a posterior headache.  Plan to obtain CT head.  Given that he took his PO keppra at about 2am and with the rapid onset of PO keppra no indication for IV keppra at this time.   CT head without acute abnormalities.  He was observed in the emergency room for 4 hours without repeat seizure-like activity.  He is ambulatory and neurologically intact.  Patient will be discharged home.  Reinforced the importance of maintaining compliance with his Keppra.  Recommended that he not drive until cleared to do so by his neurologist.    Return precautions were discussed with patient who states their understanding.  At the time of discharge patient denied any unaddressed complaints or concerns.  Patient is agreeable for discharge home.  Note: Portions of this report may have been transcribed using voice recognition software. Every effort was made to ensure accuracy; however, inadvertent computerized transcription errors may be present   Final Clinical Impression(s) / ED Diagnoses Final diagnoses:  Seizure [facility])  Patient's noncompliance with other medical treatment and regimen    Rx / DC Orders ED Discharge Orders    None       Ollen Gross 03/13/20 0233    Fatima Blank, MD 03/13/20 1840

## 2020-03-13 NOTE — Discharge Instructions (Signed)
It is very important that you are taking your Keppra as directed.  Please schedule a follow-up appointment with your neurologist.  Please do not drive, operate heavy machinery, or perform other potentially dangerous tasks until you are cleared to do so by your neurologist.

## 2020-04-10 ENCOUNTER — Other Ambulatory Visit: Payer: Self-pay

## 2020-04-10 ENCOUNTER — Ambulatory Visit
Admission: RE | Admit: 2020-04-10 | Discharge: 2020-04-10 | Disposition: A | Discharge status: Home / Self Care | Payer: No Typology Code available for payment source | Source: Ambulatory Visit | Attending: Neurology | Admitting: Neurology

## 2020-04-10 DIAGNOSIS — G40009 Localization-related (focal) (partial) idiopathic epilepsy and epileptic syndromes with seizures of localized onset, not intractable, without status epilepticus: Secondary | ICD-10-CM

## 2020-04-10 MED ORDER — GADOBENATE DIMEGLUMINE 529 MG/ML IV SOLN
14.0000 mL | Freq: Once | INTRAVENOUS | Status: AC | PRN
  Administered 2020-04-10: 14 mL via INTRAVENOUS

## 2020-04-13 ENCOUNTER — Telehealth: Payer: Self-pay | Admitting: Neurology

## 2020-04-13 NOTE — Telephone Encounter (Signed)
Patient called in wanting to find out his MRI results.p

## 2020-04-14 NOTE — Telephone Encounter (Signed)
Spoke to patient about MRI findings. No further convulsions since August 2021. He denies any staring/unresponsive episodes but has episodes lasting 10-30 seconds where he cannot speak, feels like an anxiety attack, occurring up to 4 times a day, or a week without them. They may be sleep-related, his children have not been sleeping well. Discussed temporal lobe epilepsy, would recommend that he increase dose of Keppra. He had missed 2 doses prior to the seizure last month, and had only been taking it once a day. Discussed increasing Keppra 500mg : take 1 tab BID x 2 weeks, then increase to 2 tabs BID. He will see how he feels on this dose, if having side effects, we will discuss switching to a different med. Discussed  driving laws, discussed with him that doctors do not clear patients for return to driving, this is done by the [facility] Medical Advisory Board. He will send in the medical forms for me to sign. F/u with me in 6  weeks, he knows to call for any changes.

## 2020-05-12 ENCOUNTER — Telehealth: Payer: Self-pay | Admitting: Neurology
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
## 2020-05-12 NOTE — Telephone Encounter (Signed)
When we spoke on the phone last month, he was instructed to increase Keppra 500mg  to 2 tabs BID, is he only taking 1 tab BID? If yes, pls have him increase to 2 tabs BID. Pls let patient/wife know that everyone is different as to how much seizure medication they need. If he is already on 2 tabs BID, would increase to 3 tabs BID. Can put on waitlist, but 10/13 is the earliest at this point. No driving. Thanks

## 2020-05-12 NOTE — Telephone Encounter (Signed)
Spoke to pt wife he needs to increase his keppra to 2 tabs BID and that right now his appointment on the 13th is the soonest Dr Delice Lesch has we will put pt on a wait list, pt wife verbalized understanding, she was also told that pt should not be driving with him having seizures,

## 2020-05-12 NOTE — Telephone Encounter (Signed)
Pt c/o: seizure Missed medications?  No.  Sleep deprived?  No. Alcohol intake?  No. Back to their usual baseline self?  Yes.  . If no, advise go to ER Current medications prescribed by Dr. Delice Lesch: Keppra 500mg  1 tab BID  Poss increase in stress.

## 2020-05-18 ENCOUNTER — Encounter: Payer: Self-pay | Admitting: Neurology

## 2020-05-18 ENCOUNTER — Ambulatory Visit (INDEPENDENT_AMBULATORY_CARE_PROVIDER_SITE_OTHER): Payer: Self-pay | Admitting: Neurology

## 2020-05-18 ENCOUNTER — Ambulatory Visit: Payer: Medicaid Other | Admitting: Neurology

## 2020-05-18 ENCOUNTER — Other Ambulatory Visit: Payer: Self-pay

## 2020-05-18 VITALS — BP 113/64 | HR 88 | Ht 63.0 in | Wt 157.2 lb

## 2020-05-18 DIAGNOSIS — G40009 Localization-related (focal) (partial) idiopathic epilepsy and epileptic syndromes with seizures of localized onset, not intractable, without status epilepticus: Secondary | ICD-10-CM

## 2020-05-18 MED ORDER — VALTOCO 20 MG DOSE 10 MG/0.1ML NA LQPK: NASAL | 5 refills | Status: DC

## 2020-05-18 MED ORDER — VALTOCO 15 MG DOSE 7.5 MG/0.1ML NA LQPK: NASAL | 5 refills | Status: DC

## 2020-05-18 MED ORDER — LEVETIRACETAM 500 MG PO TABS: ORAL_TABLET | ORAL | 11 refills | Status: DC
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
## 2020-05-19 ENCOUNTER — Telehealth: Payer: Self-pay

## 2020-05-19 ENCOUNTER — Ambulatory Visit (INDEPENDENT_AMBULATORY_CARE_PROVIDER_SITE_OTHER): Payer: Self-pay | Admitting: Neurology

## 2020-05-19 DIAGNOSIS — G40009 Localization-related (focal) (partial) idiopathic epilepsy and epileptic syndromes with seizures of localized onset, not intractable, without status epilepticus: Secondary | ICD-10-CM

## 2020-05-19 NOTE — Telephone Encounter (Signed)
-----   Message from Cameron Sprang, MD sent at 05/19/2020  1:10 PM EDT ----- Regarding: EEG results Can you pls call Charles Beck and let him know I reviewed his EEG. There is no ongoing seizure, but it is quite active on the left side, I would like him to increase the Keppra at this time to 3 tabs BID starting tonight. Pls send in an update Rx. Thanks

## 2020-05-19 NOTE — Telephone Encounter (Signed)
Pt called to go over EEG results no answer no voice mail set up

## 2020-05-20 ENCOUNTER — Telehealth: Payer: Self-pay

## 2020-05-20 MED ORDER — LEVETIRACETAM 500 MG PO TABS: ORAL_TABLET | ORAL | 11 refills | Status: DC

## 2020-05-20 NOTE — Telephone Encounter (Signed)
Pt called and informed that There is no ongoing seizure, but it is quite active on the left side, Dr Delice Lesch would like him to increase the Keppra at this time to 3 tabs BID starting tonight. Pt verbalized understanding,

## 2020-05-20 NOTE — Addendum Note (Signed)
Addended by: Ulice Brilliant T on: 05/20/2020 01:33 PM   Modules accepted: Orders

## 2020-05-20 NOTE — Telephone Encounter (Signed)
-----   Message from Cameron Sprang, MD sent at 05/19/2020  1:10 PM EDT ----- Regarding: EEG results Can you pls call Charles Beck and let him know I reviewed his EEG. There is no ongoing seizure, but it is quite active on the left side, I would like him to increase the Keppra at this time to 3 tabs BID starting tonight. Pls send in an update Rx. Thanks

## 2020-05-27 NOTE — Procedures (Signed)
ELECTROENCEPHALOGRAM REPORT  Date of Study: 05/19/2020  Patient's Name: Charles Beck MRN: [id number] Date of Birth: 1990-08-16  Referring Provider: Dr. Ellouise Newer  Clinical History: This is a 30 year old man with recurrent seizures. 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 Last seizure was a week ago.  Medications: Levetiracetam Lithium  Technical Summary: A multichannel digital 1-hour EEG recording measured by the international 10-20 system with electrodes applied with paste and impedances below 5000 ohms performed in our laboratory with EKG monitoring in an awake and asleep patient.  Hyperventilation was not performed. Photic stimulation was performed.  The digital EEG was referentially recorded, reformatted, and digitally filtered in a variety of bipolar and referential montages for optimal display.    Description: The patient is awake and asleep during the recording.  During maximal wakefulness, there is a symmetric, medium voltage 10 Hz posterior dominant rhythm that attenuates with eye opening.  There is frequent focal theta and delta slowing seen over the left temporal region, increasing to near-continuous slowing during drowsiness and sleep. At times there are frequent left frontotemporal sharp waves occurring in a periodic pattern at 1-2 Hz without evolution in frequency or amplitude. During drowsiness and sleep, there is an increase in theta slowing of the background.  Vertex waves and symmetric sleep spindles were seen.  Photic stimulation did not elicit any abnormalities.  There were no electrographic seizures seen.    EKG lead was unremarkable.  Impression: This 1-hour awake and asleep EEG is abnormal due to the presence of: 1. Frequent focal slowing over the left temporal region 2. Frequent left frontotemporal epileptiform discharges, at times occurring in a periodic pattern without evolution  Clinical Correlation of the above findings indicates focal cerebral  dysfunction over the left temporal region suggestive of underlying structural or physiologic abnormality. There is a tendency for seizures to arise from the left frontotemporal region. If further clinical questions remain, prolonged EEG may be helpful.  Clinical correlation is advised.   Ellouise Newer, M.D.

## 2020-06-07 ENCOUNTER — Telehealth: Payer: Self-pay | Admitting: General Practice

## 2020-06-07 NOTE — Telephone Encounter (Signed)
Attempted to call patient to schedule new patient appointment but unable to LVM.

## 2020-06-29 ENCOUNTER — Telehealth: Payer: Self-pay | Admitting: Neurology

## 2020-06-29 NOTE — Telephone Encounter (Signed)
Patient called in wanting to report he is taking an increased dose of 2000mg  of his Keppra and he is still having seizures. He said he had a grandmal seizure on 06/14/20.

## 2020-06-29 NOTE — Telephone Encounter (Signed)
Can you pls confirm with patient, last instructions were Keppra 500mg  3 tabs BID (1500mg  BID), is he taking 2000mg  BID? Thanks

## 2020-06-29 NOTE — Telephone Encounter (Signed)
fyi

## 2020-07-04 ENCOUNTER — Other Ambulatory Visit: Payer: Self-pay | Admitting: Neurology

## 2020-07-04 MED ORDER — LEVETIRACETAM 500 MG PO TABS: ORAL_TABLET | ORAL | 11 refills | Status: DC

## 2020-07-04 MED ORDER — VIMPAT 100 MG PO TABS: ORAL_TABLET | ORAL | 5 refills | Status: DC

## 2020-07-04 NOTE — Telephone Encounter (Signed)
Pls let him know that we will need to add a second seizure medication to his Keppra 2000mg  BID since he is still having a lot of seizures. Please give him samples for Vimpat 50mg  BID 1 week, then 100mg  BID for 1 week. Once he is done with samples, start Vimpat 100mg  BID sent to his pharmacy. Thanks.

## 2020-07-04 NOTE — Telephone Encounter (Signed)
Mail box is full at [phone]

## 2020-07-04 NOTE — Telephone Encounter (Signed)
Yes, that is correct patient is taken  Keppra 2000mg  bid. Please note he is having focal seizures daily per patient.

## 2020-07-04 NOTE — Telephone Encounter (Signed)
Mailbox is full at [phone] 07/04/2020.

## 2020-07-11 NOTE — Telephone Encounter (Signed)
Patient advised and samples given. Noted.

## 2020-07-28 ENCOUNTER — Encounter: Payer: Self-pay | Admitting: Family Medicine

## 2020-07-28 ENCOUNTER — Ambulatory Visit: Payer: Medicaid Other | Admitting: Family Medicine

## 2020-07-28 ENCOUNTER — Ambulatory Visit (INDEPENDENT_AMBULATORY_CARE_PROVIDER_SITE_OTHER): Payer: Self-pay | Admitting: Family Medicine

## 2020-07-28 ENCOUNTER — Other Ambulatory Visit: Payer: Self-pay

## 2020-07-28 VITALS — BP 114/68 | HR 76 | Temp 99.0°F | Ht 63.75 in | Wt 157.4 lb

## 2020-07-28 DIAGNOSIS — I890 Lymphedema, not elsewhere classified: Secondary | ICD-10-CM

## 2020-07-28 DIAGNOSIS — Z8672 Personal history of thrombophlebitis: Secondary | ICD-10-CM

## 2020-07-28 DIAGNOSIS — Z2821 Immunization not carried out because of patient refusal: Secondary | ICD-10-CM

## 2020-07-28 DIAGNOSIS — R5383 Other fatigue: Secondary | ICD-10-CM

## 2020-07-28 DIAGNOSIS — Z7689 Persons encountering health services in other specified circumstances: Secondary | ICD-10-CM

## 2020-07-28 DIAGNOSIS — D171 Benign lipomatous neoplasm of skin and subcutaneous tissue of trunk: Secondary | ICD-10-CM

## 2020-07-28 LAB — CBC
HCT: 45.5 % (ref 39.0–52.0)
Hemoglobin: 15.3 g/dL (ref 13.0–17.0)
MCHC: 33.6 g/dL (ref 30.0–36.0)
MCV: 87.8 fl (ref 78.0–100.0)
Platelets: 295 10*3/uL (ref 150.0–400.0)
RBC: 5.19 Mil/uL (ref 4.22–5.81)
RDW: 13 % (ref 11.5–15.5)
WBC: 7.8 10*3/uL (ref 4.0–10.5)

## 2020-07-28 LAB — VITAMIN B12: Vitamin B-12: 326 pg/mL (ref 211–911)

## 2020-07-28 LAB — BASIC METABOLIC PANEL
BUN: 17 mg/dL (ref 6–23)
CO2: 34 mEq/L — ABNORMAL HIGH (ref 19–32)
Calcium: 9.7 mg/dL (ref 8.4–10.5)
Chloride: 101 mEq/L (ref 96–112)
Creatinine, Ser: 0.91 mg/dL (ref 0.40–1.50)
GFR: 112.21 mL/min (ref >60.00)
Glucose, Bld: 91 mg/dL (ref 70–99)
Potassium: 4.2 mEq/L (ref 3.5–5.1)
Sodium: 140 mEq/L (ref 135–145)

## 2020-07-28 LAB — TESTOSTERONE: Testosterone: 304.06 ng/dL (ref 300.00–890.00)

## 2020-07-28 LAB — VITAMIN D 25 HYDROXY (VIT D DEFICIENCY, FRACTURES): VITD: 23.86 ng/mL — ABNORMAL LOW (ref 30.00–100.00)

## 2020-07-28 LAB — TSH: TSH: 2.1 u[IU]/mL (ref 0.35–4.50)

## 2020-07-28 NOTE — Patient Instructions (Signed)
Lipoma  A lipoma is a noncancerous (benign) tumor that is made up of fat cells. This is a very common type of soft-tissue growth. Lipomas are usually found under the skin (subcutaneous). They may occur in any tissue of the body that contains fat. Common areas for lipomas to appear include the back, arms, shoulders, buttocks, and thighs. Lipomas grow slowly, and they are usually painless. Most lipomas do not cause problems and do not require treatment. What are the causes? The cause of this condition is not known. What increases the risk? You are more likely to develop this condition if:  You are 40-60 years old.  You have a family history of lipomas. What are the signs or symptoms? A lipoma usually appears as a small, round bump under the skin. In most cases, the lump will:  Feel soft or rubbery.  Not cause pain or other symptoms. However, if a lipoma is located in an area where it pushes on nerves, it can become painful or cause other symptoms. How is this diagnosed? A lipoma can usually be diagnosed with a physical exam. You may also have tests to confirm the diagnosis and to rule out other conditions. Tests may include:  Imaging tests, such as a CT scan or an MRI.  Removal of a tissue sample to be looked at under a microscope (biopsy). How is this treated? Treatment for this condition depends on the size of the lipoma and whether it is causing any symptoms.  For small lipomas that are not causing problems, no treatment is needed.  If a lipoma is bigger or it causes problems, surgery may be done to remove the lipoma. Lipomas can also be removed to improve appearance. Most often, the procedure is done after applying a medicine that numbs the area (local anesthetic).  Liposuction may be done to reduce the size of the lipoma before it is removed through surgery, or it may be done to remove the lipoma. Lipomas are removed with this method in order to limit incision size and scarring. A  liposuction tube is inserted through a small incision into the lipoma, and the contents of the lipoma are removed through the tube with suction. Follow these instructions at home:  Watch your lipoma for any changes.  Keep all follow-up visits as told by your health care provider. This is important. Contact a health care provider if:  Your lipoma becomes larger or hard.  Your lipoma becomes painful, red, or increasingly swollen. These could be signs of infection or a more serious condition. Get help right away if:  You develop tingling or numbness in an area near the lipoma. This could indicate that the lipoma is causing nerve damage. Summary  A lipoma is a noncancerous tumor that is made up of fat cells.  Most lipomas do not cause problems and do not require treatment.  If a lipoma is bigger or it causes problems, surgery may be done to remove the lipoma.  Contact a health care provider if your lipoma becomes larger or hard, or if it becomes painful, red, or increasingly swollen. Pain, redness, and swelling could be signs of infection or a more serious condition. This information is not intended to replace advice given to you by your health care provider. Make sure you discuss any questions you have with your health care provider. Document Revised: 03/09/2019 Document Reviewed: 03/09/2019 Elsevier Patient Education  2020 [employer].  
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
## 2020-07-29 LAB — IRON,TIBC AND FERRITIN PANEL
%SAT: 60 % (calc) — ABNORMAL HIGH (ref 20–48)
Ferritin: 114 ng/mL (ref 38–380)
Iron: 205 ug/dL — ABNORMAL HIGH (ref 50–180)
TIBC: 342 mcg/dL (calc) (ref 250–425)

## 2020-08-10 ENCOUNTER — Other Ambulatory Visit: Payer: Self-pay | Admitting: Family Medicine

## 2020-08-10 ENCOUNTER — Encounter: Payer: Self-pay | Admitting: Family Medicine

## 2020-08-10 DIAGNOSIS — E559 Vitamin D deficiency, unspecified: Secondary | ICD-10-CM

## 2020-08-10 MED ORDER — VITAMIN D (ERGOCALCIFEROL) 1.25 MG (50000 UNIT) PO CAPS: 50000.0000 [IU] | ORAL_CAPSULE | ORAL | 2 refills | Status: DC

## 2020-08-13 ENCOUNTER — Other Ambulatory Visit: Payer: Self-pay | Admitting: Neurology

## 2020-08-13 MED ORDER — VIMPAT 100 MG PO TABS: ORAL_TABLET | ORAL | 5 refills | Status: DC

## 2020-08-15 ENCOUNTER — Telehealth: Payer: Self-pay | Admitting: Neurology

## 2020-08-15 MED ORDER — OXCARBAZEPINE 300 MG PO TABS: ORAL_TABLET | ORAL | 6 refills | Status: DC

## 2020-08-15 NOTE — Telephone Encounter (Signed)
Spoke with pharmacist at [facility] who states the rx will cost the patient one thousand dollars and that is with a GoodRx coupon. He states he does not think the patient has insurance and if he does it is not covering the rx. He states the patient can not afford the rx.

## 2020-08-15 NOTE — Telephone Encounter (Signed)
When he was on Vimpat, did not give any difference and has not taken it for 3 days with no difference. Last seizure was today, 1-2 focal seizures daily, last GTC was 12/10. Discussed starting oxcarbazepine 300mg  BID x 1 week, then increase to 600mg  BID. Side effects discussed. F/u as scheduled this month, he knows to call for any changes.

## 2020-09-01 ENCOUNTER — Other Ambulatory Visit: Payer: Self-pay

## 2020-09-01 ENCOUNTER — Encounter: Payer: Self-pay | Admitting: Neurology

## 2020-09-01 ENCOUNTER — Ambulatory Visit (INDEPENDENT_AMBULATORY_CARE_PROVIDER_SITE_OTHER): Payer: Self-pay | Admitting: Neurology

## 2020-09-01 VITALS — BP 114/71 | HR 66 | Ht 63.75 in | Wt 162.0 lb

## 2020-09-01 DIAGNOSIS — E559 Vitamin D deficiency, unspecified: Secondary | ICD-10-CM

## 2020-09-01 DIAGNOSIS — G40009 Localization-related (focal) (partial) idiopathic epilepsy and epileptic syndromes with seizures of localized onset, not intractable, without status epilepticus: Secondary | ICD-10-CM

## 2020-09-01 MED ORDER — LEVETIRACETAM 500 MG PO TABS: ORAL_TABLET | ORAL | 11 refills | Status: DC

## 2020-09-01 MED ORDER — VITAMIN D (ERGOCALCIFEROL) 1.25 MG (50000 UNIT) PO CAPS: 50000.0000 [IU] | ORAL_CAPSULE | ORAL | 0 refills | Status: DC

## 2020-09-01 MED ORDER — OXCARBAZEPINE 600 MG PO TABS: ORAL_TABLET | ORAL | 11 refills | Status: DC
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
## 2020-12-06 ENCOUNTER — Other Ambulatory Visit (INDEPENDENT_AMBULATORY_CARE_PROVIDER_SITE_OTHER): Payer: Self-pay

## 2020-12-06 ENCOUNTER — Other Ambulatory Visit: Payer: Self-pay

## 2020-12-06 ENCOUNTER — Encounter: Payer: Self-pay | Admitting: Neurology

## 2020-12-06 ENCOUNTER — Ambulatory Visit: Payer: Self-pay | Admitting: Neurology

## 2020-12-06 VITALS — BP 107/73 | HR 78 | Ht 63.0 in | Wt 166.2 lb

## 2020-12-06 DIAGNOSIS — G40009 Localization-related (focal) (partial) idiopathic epilepsy and epileptic syndromes with seizures of localized onset, not intractable, without status epilepticus: Secondary | ICD-10-CM

## 2020-12-06 DIAGNOSIS — R5383 Other fatigue: Secondary | ICD-10-CM

## 2020-12-06 DIAGNOSIS — E559 Vitamin D deficiency, unspecified: Secondary | ICD-10-CM

## 2020-12-06 DIAGNOSIS — R531 Weakness: Secondary | ICD-10-CM

## 2020-12-06 LAB — CBC
HCT: 44.2 % (ref 39.0–52.0)
Hemoglobin: 15.2 g/dL (ref 13.0–17.0)
MCHC: 34.4 g/dL (ref 30.0–36.0)
MCV: 87.6 fl (ref 78.0–100.0)
Platelets: 302 10*3/uL (ref 150.0–400.0)
RBC: 5.05 Mil/uL (ref 4.22–5.81)
RDW: 12.7 % (ref 11.5–15.5)
WBC: 7.1 10*3/uL (ref 4.0–10.5)

## 2020-12-06 LAB — COMPREHENSIVE METABOLIC PANEL
ALT: 17 U/L (ref 0–53)
AST: 15 U/L (ref 0–37)
Albumin: 4.7 g/dL (ref 3.5–5.2)
Alkaline Phosphatase: 53 U/L (ref 39–117)
BUN: 18 mg/dL (ref 6–23)
CO2: 32 mEq/L (ref 19–32)
Calcium: 9.5 mg/dL (ref 8.4–10.5)
Chloride: 101 mEq/L (ref 96–112)
Creatinine, Ser: 1.05 mg/dL (ref 0.40–1.50)
GFR: 94.27 mL/min (ref >60.00)
Glucose, Bld: 93 mg/dL (ref 70–99)
Potassium: 4.3 mEq/L (ref 3.5–5.1)
Sodium: 138 mEq/L (ref 135–145)
Total Bilirubin: 0.5 mg/dL (ref 0.2–1.2)
Total Protein: 7.5 g/dL (ref 6.0–8.3)

## 2020-12-06 LAB — VITAMIN D 25 HYDROXY (VIT D DEFICIENCY, FRACTURES): VITD: 23.74 ng/mL — ABNORMAL LOW (ref 30.00–100.00)

## 2020-12-06 LAB — CK: Total CK: 65 U/L (ref 7–232)

## 2020-12-06 MED ORDER — LEVETIRACETAM 500 MG PO TABS: ORAL_TABLET | ORAL | 11 refills | Status: DC

## 2020-12-06 MED ORDER — VITAMIN D (ERGOCALCIFEROL) 1.25 MG (50000 UNIT) PO CAPS: 50000.0000 [IU] | ORAL_CAPSULE | ORAL | 0 refills | Status: DC
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
## 2020-12-07 ENCOUNTER — Telehealth: Payer: Self-pay

## 2020-12-07 NOTE — Telephone Encounter (Signed)
-----   Message from Cameron Sprang, MD sent at 12/07/2020 12:52 PM EDT ----- Pls let Collier Salina know his counts, liver, kidney looked good. His vitamin D level is still low, pls encourage him to start the high dose replacement once a week I sent to his pharmacy, thanks

## 2020-12-07 NOTE — Telephone Encounter (Signed)
Pt called no voicemail set up will call back later

## 2020-12-08 ENCOUNTER — Telehealth: Payer: Self-pay

## 2020-12-08 NOTE — Telephone Encounter (Signed)
-----   Message from Karen M Aquino, MD sent at 12/07/2020 12:52 PM EDT ----- Pls let Charles Beck know his counts, liver, kidney looked good. His vitamin D level is still low, pls encourage him to start the high dose replacement once a week I sent to his pharmacy, thanks 

## 2020-12-08 NOTE — Telephone Encounter (Signed)
Pt called no answer no voice mail set up 

## 2020-12-09 ENCOUNTER — Telehealth: Payer: Self-pay

## 2020-12-09 NOTE — Telephone Encounter (Signed)
Pt called no answer no voice mail, mychart message sent with results

## 2020-12-09 NOTE — Telephone Encounter (Signed)
-----   Message from Karen M Aquino, MD sent at 12/07/2020 12:52 PM EDT ----- Pls let Krishav know his counts, liver, kidney looked good. His vitamin D level is still low, pls encourage him to start the high dose replacement once a week I sent to his pharmacy, thanks 

## 2020-12-13 ENCOUNTER — Other Ambulatory Visit: Payer: Self-pay | Admitting: Neurology

## 2020-12-13 MED ORDER — LEVETIRACETAM 500 MG PO TABS: ORAL_TABLET | ORAL | 11 refills | Status: DC

## 2021-01-23 ENCOUNTER — Telehealth: Payer: Self-pay | Admitting: Neurology

## 2021-03-08 ENCOUNTER — Ambulatory Visit: Payer: Medicaid Other | Admitting: Neurology

## 2021-03-09 ENCOUNTER — Other Ambulatory Visit: Payer: Self-pay

## 2021-03-09 ENCOUNTER — Emergency Department (HOSPITAL_COMMUNITY)
Admission: EM | Admit: 2021-03-09 | Discharge: 2021-03-09 | Disposition: A | Discharge status: Home / Self Care | Payer: Medicaid Other | Attending: Emergency Medicine | Admitting: Emergency Medicine

## 2021-03-09 ENCOUNTER — Emergency Department (HOSPITAL_COMMUNITY): Payer: Medicaid Other

## 2021-03-09 ENCOUNTER — Encounter (HOSPITAL_COMMUNITY): Payer: Self-pay | Admitting: Emergency Medicine

## 2021-03-09 DIAGNOSIS — R531 Weakness: Secondary | ICD-10-CM | POA: Insufficient documentation

## 2021-03-09 DIAGNOSIS — R791 Abnormal coagulation profile: Secondary | ICD-10-CM | POA: Insufficient documentation

## 2021-03-09 DIAGNOSIS — R519 Headache, unspecified: Secondary | ICD-10-CM | POA: Insufficient documentation

## 2021-03-09 DIAGNOSIS — R410 Disorientation, unspecified: Secondary | ICD-10-CM | POA: Insufficient documentation

## 2021-03-09 DIAGNOSIS — Z91040 Latex allergy status: Secondary | ICD-10-CM | POA: Insufficient documentation

## 2021-03-09 LAB — CBC WITH DIFFERENTIAL/PLATELET
Abs Immature Granulocytes: 0.06 10*3/uL (ref 0.00–0.07)
Basophils Absolute: 0.1 10*3/uL (ref 0.0–0.1)
Basophils Relative: 1 %
Eosinophils Absolute: 0.4 10*3/uL (ref 0.0–0.5)
Eosinophils Relative: 3 %
HCT: 42.7 % (ref 39.0–52.0)
Hemoglobin: 14.8 g/dL (ref 13.0–17.0)
Immature Granulocytes: 1 %
Lymphocytes Relative: 31 %
Lymphs Abs: 3.2 10*3/uL (ref 0.7–4.0)
MCH: 30.5 pg (ref 26.0–34.0)
MCHC: 34.7 g/dL (ref 30.0–36.0)
MCV: 88 fL (ref 80.0–100.0)
Monocytes Absolute: 1.3 10*3/uL — ABNORMAL HIGH (ref 0.1–1.0)
Monocytes Relative: 12 %
Neutro Abs: 5.5 10*3/uL (ref 1.7–7.7)
Neutrophils Relative %: 52 %
Platelets: 333 10*3/uL (ref 150–400)
RBC: 4.85 MIL/uL (ref 4.22–5.81)
RDW: 12.2 % (ref 11.5–15.5)
WBC: 10.4 10*3/uL (ref 4.0–10.5)
nRBC: 0 % (ref 0.0–0.2)

## 2021-03-09 LAB — URINALYSIS, ROUTINE W REFLEX MICROSCOPIC
Bilirubin Urine: NEGATIVE
Glucose, UA: NEGATIVE mg/dL
Hgb urine dipstick: NEGATIVE
Ketones, ur: NEGATIVE mg/dL
Leukocytes,Ua: NEGATIVE
Nitrite: NEGATIVE
Protein, ur: NEGATIVE mg/dL
Specific Gravity, Urine: 1.029 (ref 1.005–1.030)
pH: 6 (ref 5.0–8.0)

## 2021-03-09 LAB — BASIC METABOLIC PANEL
Anion gap: 11 (ref 5–15)
BUN: 15 mg/dL (ref 6–20)
CO2: 27 mmol/L (ref 22–32)
Calcium: 8.9 mg/dL (ref 8.9–10.3)
Chloride: 96 mmol/L — ABNORMAL LOW (ref 98–111)
Creatinine, Ser: 0.81 mg/dL (ref 0.61–1.24)
GFR, Estimated: >60 mL/min (ref >60)
Glucose, Bld: 98 mg/dL (ref 70–99)
Potassium: 3.6 mmol/L (ref 3.5–5.1)
Sodium: 134 mmol/L — ABNORMAL LOW (ref 135–145)

## 2021-03-09 LAB — PROTIME-INR
INR: 1.1 (ref 0.8–1.2)
Prothrombin Time: 13.9 seconds (ref 11.4–15.2)

## 2021-03-09 MED ORDER — SODIUM CHLORIDE 0.9 % IV SOLN
750.0000 mg | Freq: Once | INTRAVENOUS | Status: AC
  Administered 2021-03-09: 750 mg via INTRAVENOUS
  Filled 2021-03-09: qty 7.5

## 2021-03-09 MED ORDER — OXCARBAZEPINE 300 MG PO TABS
600.0000 mg | ORAL_TABLET | Freq: Two times a day (BID) | ORAL | Status: DC
  Administered 2021-03-09: 600 mg via ORAL
  Filled 2021-03-09: qty 2

## 2021-03-09 NOTE — ED Provider Notes (Signed)
[place] DEPT Provider Note   CSN: [id number] Arrival date & time: 03/09/21  1858     History Chief Complaint  Patient presents with   Headache   Fatigue    Charles Beck is a 30 y.o. male.  Patient states he had a severe headache that only lasted maybe 20 to 30 seconds with mild confusion.  He has a history of seizures.  He states he is back to normal now  The history is provided by the patient and medical records. No language interpreter was used.  Headache Pain location:  Generalized Quality:  Dull and stabbing Radiates to:  Does not radiate Severity currently:  0/10 Severity at highest:  8/10 Onset quality:  Sudden Duration: 20 seconds. Progression:  Resolved Chronicity:  New Similar to prior headaches: no   Associated symptoms: no abdominal pain, no back pain, no congestion, no cough, no diarrhea, no fatigue, no seizures and no sinus pressure       Past Medical History:  Diagnosis Date   Anxiety attack    Cellulitis    Lymphedema    Phlebitis    Seizures ([place])     Patient Active Problem List   Diagnosis Date Noted   Vitamin D deficiency 08/10/2020    Past Surgical History:  Procedure Laterality Date   CATARACT EXTRACTION Left    TONSILLECTOMY         Family History  Problem Relation Age of Onset   Jaundice Mother     Social History   Tobacco Use   Smoking status: Never   Smokeless tobacco: Never  Vaping Use   Vaping Use: Never used  Substance Use Topics   Alcohol use: Not Currently   Drug use: Never    Home Medications Prior to Admission medications   Medication Sig Start Date End Date Taking? Authorizing Provider  ibuprofen (ADVIL) 200 MG tablet Take 400 mg by mouth every 6 (six) hours as needed for mild pain.   Yes [provider]  levETIRAcetam (KEPPRA) 500 MG tablet Take 3 tablets twice a day Patient taking differently: Take 1,500 mg by mouth in the morning and at bedtime. 12/13/20  Yes  Cameron Sprang, MD  magnesium 30 MG tablet Take 30 mg by mouth daily.   Yes [provider]  Multiple Vitamin (STRESS FORMULA) TABS Take 1 tablet by mouth See admin instructions. Take 1 tablet by mouth one to two times a day   Yes [provider]  Multiple Vitamins-Minerals (MULTIVITAMIN WITH MINERALS) tablet Take 1 tablet by mouth daily.   Yes [provider]  oxcarbazepine (TRILEPTAL) 600 MG tablet Take 1 tablet twice a day Patient taking differently: Take 600 mg by mouth in the morning and at bedtime. 09/01/20  Yes Cameron Sprang, MD  Vitamin D, Ergocalciferol, (DRISDOL) 1.25 MG (50000 UNIT) CAPS capsule Take 1 capsule (50,000 Units total) by mouth every 7 (seven) days. Patient not taking: Reported on 03/09/2021 12/06/20   Cameron Sprang, MD    Allergies    Doxycycline, Keflex [cephalexin], Banana, Latex, Other, and Tape  Review of Systems   Review of Systems  Constitutional:  Negative for appetite change and fatigue.  HENT:  Negative for congestion, ear discharge and sinus pressure.   Eyes:  Negative for discharge.  Respiratory:  Negative for cough.   Cardiovascular:  Negative for chest pain.  Gastrointestinal:  Negative for abdominal pain and diarrhea.  Genitourinary:  Negative for frequency and hematuria.  Musculoskeletal:  Negative for back pain.  Skin:  Negative for rash.  Neurological:  Positive for headaches. Negative for seizures.  Psychiatric/Behavioral:  Negative for hallucinations.    Physical Exam Updated Vital Signs BP (!) 144/88   Pulse 65   Temp 98.5 F (36.9 C) (Oral)   Resp 14   Ht '5\' 3"'$  (1.6 m)   Wt 73.9 kg   SpO2 100%   BMI 28.87 kg/m   Physical Exam Vitals and nursing note reviewed.  Constitutional:      Appearance: He is well-developed.  HENT:     Head: Normocephalic.     Nose: Nose normal.  Eyes:     General: No scleral icterus.    Conjunctiva/sclera: Conjunctivae normal.  Neck:     Thyroid: No thyromegaly.   Cardiovascular:     Rate and Rhythm: Normal rate and regular rhythm.     Heart sounds: No murmur heard.   No friction rub. No gallop.  Pulmonary:     Breath sounds: No stridor. No wheezing or rales.  Chest:     Chest wall: No tenderness.  Abdominal:     General: There is no distension.     Tenderness: There is no abdominal tenderness. There is no rebound.  Musculoskeletal:        General: Normal range of motion.     Cervical back: Neck supple.  Lymphadenopathy:     Cervical: No cervical adenopathy.  Skin:    Findings: No erythema or rash.  Neurological:     Mental Status: He is alert and oriented to person, place, and time.     Motor: No abnormal muscle tone.     Coordination: Coordination normal.  Psychiatric:        Behavior: Behavior normal.    ED Results / Procedures / Treatments   Labs (all labs ordered are listed, but only abnormal results are displayed) Labs Reviewed  BASIC METABOLIC PANEL - Abnormal; Notable for the following components:      Result Value   Sodium 134 (*)    Chloride 96 (*)    All other components within normal limits  CBC WITH DIFFERENTIAL/PLATELET - Abnormal; Notable for the following components:   Monocytes Absolute 1.3 (*)    All other components within normal limits  PROTIME-INR  URINALYSIS, ROUTINE W REFLEX MICROSCOPIC  LEVETIRACETAM LEVEL  GC/CHLAMYDIA PROBE AMP ([place]) NOT AT [facility]    EKG None  Radiology CT Head Wo Contrast  Result Date: 03/09/2021 CLINICAL DATA:  Sudden severe headache since 2:30 p.m., altered level of consciousness EXAM: CT HEAD WITHOUT CONTRAST TECHNIQUE: Contiguous axial images were obtained from the base of the skull through the vertex without intravenous contrast. COMPARISON:  04/10/2020, 03/13/2020 FINDINGS: Brain: No acute infarct or hemorrhage. Lateral ventricles and midline structures are unremarkable. There are no acute extra-axial fluid collections. No mass effect. Stable hypodensity and  calcifications within the left mesial temporal region. Vascular: No hyperdense vessel or unexpected calcification. Skull: Normal. Negative for fracture or focal lesion. Sinuses/Orbits: No acute finding. Other: None. IMPRESSION: 1. No acute intracranial process. 2. 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 Electronically Signed   By: Randa Ngo M.D.   On: 03/09/2021 20:13    Procedures Procedures   Medications Ordered in ED Medications  Oxcarbazepine (TRILEPTAL) tablet 600 mg (600 mg Oral Given 03/09/21 2218)  levETIRAcetam (KEPPRA) 750 mg in sodium chloride 0.9 % 100 mL IVPB (750 mg Intravenous New Bag/Given 03/09/21 2218)  ED Course  I have reviewed the triage vital signs and the nursing notes.  Pertinent labs & imaging results that were available during my care of the patient were reviewed by me and considered in my medical decision making (see chart for details).    MDM Rules/Calculators/A&P                           Headache that has resolved.  Patient will follow up with his neurologist Final Clinical Impression(s) / ED Diagnoses Final diagnoses:  Weakness  Bad headache    Rx / DC Orders ED Discharge Orders     None        Milton Ferguson, MD 03/12/21 [phone]

## 2021-03-09 NOTE — ED Provider Notes (Signed)
Emergency Medicine Provider Triage Evaluation Note  Charles Beck , a 30 y.o. male  was evaluated in triage.  Pt complains of sudden severe headache that started on 230 this afternoon with pain over the right ear and transient altered level of consciousness without loss of consciousness.  No blurred or double vision.  History of seizures on Keppra.  Additionally endorses decreased urine output and penile pain without penile discharge.  Review of Systems  Positive: "Thunderclap headache", altered level of consciousness transiently, penile pain, decreased urine output Negative: Nausea, vomiting, fevers, chills, loss of consciousness  Physical Exam  BP 136/86 (BP Location: Right Arm)   Pulse 66   Temp 98.1 F (36.7 C) (Oral)   Resp 16   Ht '5\' 3"'$  (1.6 m)   Wt 73.9 kg   SpO2 100%   BMI 28.87 kg/m  Gen:   Awake, no distress   Resp:  Normal effort  MSK:   Moves extremities without difficulty  Other:  RRR no M/R/G.  PERRL, EOMI, no focal deficit on neuro exam.  Medical Decision Making  Medically screening exam initiated at 7:32 PM.  Appropriate orders placed.  Charles Beck was informed that the remainder of the evaluation will be completed by another provider, this initial triage assessment does not replace that evaluation, and the importance of remaining in the ED until their evaluation is complete.  This chart was dictated using voice recognition software, Dragon. Despite the best efforts of this provider to proofread and correct errors, errors may still occur which can change documentation meaning.    [name] 03/09/21 1933    Milton Ferguson, MD 03/12/21 [phone]

## 2021-03-09 NOTE — ED Triage Notes (Signed)
Pt c/o facial swelling and headache since 3 PM today. Pt states the HA was sudden onset and he "blacked out" and couldn't talk. Also c/o worsening fatigue. Hx of seizures.

## 2021-03-09 NOTE — Discharge Instructions (Addendum)
Follow-up with your neurologist the next couple weeks for recheck.

## 2021-05-02 ENCOUNTER — Encounter: Payer: Self-pay | Admitting: Neurology

## 2021-05-02 ENCOUNTER — Other Ambulatory Visit: Payer: Self-pay

## 2021-05-02 ENCOUNTER — Ambulatory Visit (INDEPENDENT_AMBULATORY_CARE_PROVIDER_SITE_OTHER): Payer: Self-pay | Admitting: Neurology

## 2021-05-02 VITALS — BP 107/73 | HR 67 | Ht 63.0 in | Wt 163.6 lb

## 2021-05-02 DIAGNOSIS — G40009 Localization-related (focal) (partial) idiopathic epilepsy and epileptic syndromes with seizures of localized onset, not intractable, without status epilepticus: Secondary | ICD-10-CM

## 2021-05-02 DIAGNOSIS — R5383 Other fatigue: Secondary | ICD-10-CM

## 2021-05-02 DIAGNOSIS — R413 Other amnesia: Secondary | ICD-10-CM

## 2021-05-02 MED ORDER — LEVETIRACETAM 500 MG PO TABS: ORAL_TABLET | ORAL | 11 refills | Status: DC

## 2021-05-02 MED ORDER — OXCARBAZEPINE 600 MG PO TABS: ORAL_TABLET | ORAL | 11 refills | Status: DC
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
## 2021-05-12 ENCOUNTER — Ambulatory Visit (INDEPENDENT_AMBULATORY_CARE_PROVIDER_SITE_OTHER): Payer: Self-pay | Admitting: Family Medicine

## 2021-05-12 ENCOUNTER — Encounter: Payer: Self-pay | Admitting: Family Medicine

## 2021-05-12 VITALS — BP 115/62 | HR 71 | Temp 98.2°F | Ht 63.78 in | Wt 165.5 lb

## 2021-05-12 DIAGNOSIS — I872 Venous insufficiency (chronic) (peripheral): Secondary | ICD-10-CM

## 2021-05-12 DIAGNOSIS — G40909 Epilepsy, unspecified, not intractable, without status epilepticus: Secondary | ICD-10-CM

## 2021-05-12 DIAGNOSIS — I890 Lymphedema, not elsewhere classified: Secondary | ICD-10-CM

## 2021-05-12 NOTE — Patient Instructions (Signed)
Great to meet you today! I have placed referrals for lymphedema clinic and vascular office.

## 2021-05-14 ENCOUNTER — Encounter: Payer: Self-pay | Admitting: Family Medicine

## 2021-05-14 DIAGNOSIS — I872 Venous insufficiency (chronic) (peripheral): Secondary | ICD-10-CM | POA: Insufficient documentation

## 2021-05-14 DIAGNOSIS — G40909 Epilepsy, unspecified, not intractable, without status epilepticus: Secondary | ICD-10-CM | POA: Insufficient documentation

## 2021-05-14 DIAGNOSIS — I890 Lymphedema, not elsewhere classified: Secondary | ICD-10-CM | POA: Insufficient documentation

## 2021-05-14 NOTE — Assessment & Plan Note (Signed)
Chronic venous insufficiency and lymphedema.  I encouraged him to wear compression garments especially in his lower extremities to help with lymph and venous return.  Referral placed to lymphedema clinic per his request.  Also place referral to vascular surgery office.

## 2021-05-14 NOTE — Assessment & Plan Note (Signed)
He will continue follow-up and management with neurology.  Stable at this time with current medications.

## 2021-05-14 NOTE — Progress Notes (Signed)
Charles Beck - 30 y.o. male MRN [id number]  Date of birth: [birth year]  Subjective Chief Complaint  Patient presents with   Lymphadenopathy    HPI Charles Beck is a 30 year old male here today for initial visit to establish care.  He has history of seizure disorder that is managed by neurology.  He reported he has been fairly stable with Trileptal and Keppra.  He also reports problems with recurrent lymphedema.  Initially started in his legs however has become more widespread throughout his body affecting his arms and neck as well.  He reports having lymphedema since childhood.  He states that he was seen at [facility] when he was a child by vascular surgery and told that he had anomalies and compliance with his vascular and lymph system.  He is requesting referral to lymphedema clinic as well as follow-up with vascular surgery.  He denies any increased pain related to this.  He has not used any kind of compression garments.  ROS:  A comprehensive ROS was completed and negative except as noted per HPI  Allergies  Allergen Reactions   Doxycycline Shortness Of Breath   Keflex [Cephalexin] Shortness Of Breath   Banana Swelling and Other (See Comments)    Lips swell   Latex Swelling and Other (See Comments)    "Skin irritation," also   Other Itching, Swelling and Other (See Comments)    Melons - Lips and ears swell, but no breathing issues  Green tea - Body swells and dizziness, but no breathing issues  Insect bites/stings - Systemic inflammation and itching, also   Tape Other (See Comments)    Skin redness    Past Medical History:  Diagnosis Date   Anxiety attack    Cellulitis    Lymphedema    Phlebitis    Seizures (HCC)     Past Surgical History:  Procedure Laterality Date   CATARACT EXTRACTION Left    TONSILLECTOMY AND ADENOIDECTOMY      Social History   Socioeconomic History   Marital status: Married    Spouse name: Not on file   Number of children: Not on file   Years of  education: Not on file   Highest education level: Not on file  Occupational History   Not on file  Tobacco Use   Smoking status: Never    Passive exposure: Never   Smokeless tobacco: Never  Vaping Use   Vaping Use: Never used  Substance and Sexual Activity   Alcohol use: Yes    Alcohol/week: 1.0 standard drink    Types: 1 Glasses of wine per week   Drug use: Never   Sexual activity: Yes    Partners: Female  Other Topics Concern   Not on file  Social History Narrative   Right handed   2 story home   Lives with family wife 4 kids   Social Determinants of Health   Financial Resource Strain: Not on file  Food Insecurity: Not on file  Transportation Needs: Not on file  Physical Activity: Not on file  Stress: Not on file  Social Connections: Not on file    Family History  Problem Relation Age of Onset   Jaundice Mother     Health Maintenance  Topic Date Due   HIV Screening  Never done   Hepatitis C Screening  Never done   TETANUS/TDAP  Never done   INFLUENZA VACCINE  Never done   HPV VACCINES  Aged Out     ----------------------------------------------------------------------------------------------------------------------------------------------------------------------------------------------------------------- Physical Exam BP  115/62 (BP Location: Left Arm, Patient Position: Sitting, Cuff Size: Normal)   Pulse 71   Temp 98.2 F (36.8 C)   Ht 5' 3.78" (1.62 m)   Wt 165 lb 8 oz (75.1 kg)   SpO2 99%   BMI 28.60 kg/m   Physical Exam Constitutional:      Appearance: Normal appearance.  Eyes:     General: No scleral icterus. Cardiovascular:     Rate and Rhythm: Normal rate and regular rhythm.  Pulmonary:     Effort: Pulmonary effort is normal.     Breath sounds: Normal breath sounds.  Musculoskeletal:     Cervical back: Neck supple.     Right lower leg: No edema.     Left lower leg: No edema.     Comments: Trace bilateral edema noted.  Skin:     General: Skin is warm and dry.  Neurological:     Mental Status: He is alert.  Psychiatric:        Mood and Affect: Mood normal.        Behavior: Behavior normal.    ------------------------------------------------------------------------------------------------------------------------------------------------------------------------------------------------------------------- Assessment and Plan  Seizure disorder [facility]) He will continue follow-up and management with neurology.  Stable at this time with current medications.  Chronic acquired lymphedema Chronic venous insufficiency and lymphedema.  I encouraged him to wear compression garments especially in his lower extremities to help with lymph and venous return.  Referral placed to lymphedema clinic per his request.  Also place referral to vascular surgery office.   No orders of the defined types were placed in this encounter.   No follow-ups on file.    This visit occurred during the SARS-CoV-2 public health emergency.  Safety protocols were in place, including screening questions prior to the visit, additional usage of staff PPE, and extensive cleaning of exam room while observing appropriate contact time as indicated for disinfecting solutions.

## 2021-06-09 ENCOUNTER — Other Ambulatory Visit: Payer: Self-pay

## 2021-06-09 DIAGNOSIS — I872 Venous insufficiency (chronic) (peripheral): Secondary | ICD-10-CM

## 2021-06-20 ENCOUNTER — Ambulatory Visit (INDEPENDENT_AMBULATORY_CARE_PROVIDER_SITE_OTHER): Payer: Self-pay | Admitting: Physician Assistant

## 2021-06-20 ENCOUNTER — Other Ambulatory Visit: Payer: Self-pay

## 2021-06-20 ENCOUNTER — Ambulatory Visit (HOSPITAL_COMMUNITY)
Admission: RE | Admit: 2021-06-20 | Discharge: 2021-06-20 | Disposition: A | Discharge status: Home / Self Care | Payer: Self-pay | Source: Ambulatory Visit | Attending: Vascular Surgery | Admitting: Vascular Surgery

## 2021-06-20 VITALS — BP 122/82 | HR 78 | Temp 97.4°F | Resp 18 | Ht 63.0 in | Wt 166.0 lb

## 2021-06-20 DIAGNOSIS — I890 Lymphedema, not elsewhere classified: Secondary | ICD-10-CM

## 2021-06-20 DIAGNOSIS — I872 Venous insufficiency (chronic) (peripheral): Secondary | ICD-10-CM | POA: Insufficient documentation

## 2021-06-20 NOTE — Progress Notes (Signed)
Requested by:  Luetta Nutting, [address] [place],  [place] [address]  Reason for consultation: lymphedema    History of Present Illness   Charles Beck is a 30 y.o. (1991-04-27) male who presents for evaluation of chronic lymphedema. He states he was first diagnosed with chronic lower extremity lymphedema at age 63. He was living in [place] state at that time. He was prescribed compression stockings and lymphedema pumps. He states he does not wear the stockings due to "pooling" of fluid above his knees and states he has intermittent LE cellulitis that he attributes to his stockings and the pump. He presents today due to progression his lymphedema to his upper body, particularly into his left upper chest and shoulder. His medical history is significant for Noonan syndrome and 3 years ago diagnosed with seizure disorder.  Venous symptoms include: positive if (X) [  ] aching [  ] heavy [  ] tired  [  ] throbbing [  ] burning  [  ] itching [ x ]swelling [  ] bleeding [  ] ulcer  Onset/duration:  18 years  Occupation:   Aggravating factors: none Alleviating factors: low sodium, plant based diet Compression:  yes Helps:  no Pain medications:  Advil Previous vein procedures:  no History of DVT:  no  Past Medical History:  Diagnosis Date   Anxiety attack    Cellulitis    Lymphedema    Phlebitis    Seizures ([place])     Past Surgical History:  Procedure Laterality Date   CATARACT EXTRACTION Left    TONSILLECTOMY AND ADENOIDECTOMY      Social History   Socioeconomic History   Marital status: Married    Spouse name: Not on file   Number of children: Not on file   Years of education: Not on file   Highest education level: Not on file  Occupational History   Not on file  Tobacco Use   Smoking status: Never    Passive exposure: Never   Smokeless tobacco: Never  Vaping Use   Vaping Use: Never used  Substance and Sexual Activity   Alcohol  use: Yes    Alcohol/week: 1.0 standard drink    Types: 1 Glasses of wine per week   Drug use: Never   Sexual activity: Yes    Partners: Female  Other Topics Concern   Not on file  Social History Narrative   Right handed   2 story home   Lives with family wife 4 kids   Social Determinants of Health   Financial Resource Strain: Not on file  Food Insecurity: Not on file  Transportation Needs: Not on file  Physical Activity: Not on file  Stress: Not on file  Social Connections: Not on file  Intimate Partner Violence: Not on file    Family History  Problem Relation Age of Onset   Jaundice Mother     Current Outpatient Medications  Medication Sig Dispense Refill   ibuprofen (ADVIL) 200 MG tablet Take 400 mg by mouth every 6 (six) hours as needed for mild pain.     levETIRAcetam (KEPPRA) 500 MG tablet Take 3 tablets twice a day 180 tablet 11   magnesium 30 MG tablet Take 30 mg by mouth daily.     Multiple Vitamin (STRESS FORMULA) TABS Take 1 tablet by mouth See admin instructions. Take 1 tablet by mouth one to two times a day     oxcarbazepine (  TRILEPTAL) 600 MG tablet Take 1 tablet twice a day 60 tablet 11   No current facility-administered medications for this visit.    Allergies  Allergen Reactions   Doxycycline Shortness Of Breath   Keflex [Cephalexin] Shortness Of Breath   Banana Swelling and Other (See Comments)    Lips swell   Latex Swelling and Other (See Comments)    "Skin irritation," also   Other Itching, Swelling and Other (See Comments)    Melons - Lips and ears swell, but no breathing issues  Green tea - Body swells and dizziness, but no breathing issues  Insect bites/stings - Systemic inflammation and itching, also   Tape Other (See Comments)    Skin redness    REVIEW OF SYSTEMS (negative unless checked):   Cardiac:  []  Chest pain or chest pressure? []  Shortness of breath upon activity? []  Shortness of breath when lying flat? []  Irregular heart  rhythm?  Vascular:  []  Pain in calf, thigh, or hip brought on by walking? []  Pain in feet at night that wakes you up from your sleep? []  Blood clot in your veins? [x]  Leg swelling?  Pulmonary:  []  Oxygen at home? []  Productive cough? []  Wheezing?  Neurologic:  []  Sudden weakness in arms or legs? []  Sudden numbness in arms or legs? []  Sudden onset of difficult speaking or slurred speech? []  Temporary loss of vision in one eye? []  Problems with dizziness?  Gastrointestinal:  []  Blood in stool? []  Vomited blood?  Genitourinary:  []  Burning when urinating? []  Blood in urine?  Psychiatric:  []  Major depression  Hematologic:  []  Bleeding problems? []  Problems with blood clotting?  Dermatologic:  []  Rashes or ulcers?  Constitutional:  []  Fever or chills?  Ear/Nose/Throat:  []  Change in hearing? []  Nose bleeds? []  Sore throat?  Musculoskeletal:  []  Back pain? []  Joint pain? []  Muscle pain?   Physical Examination     Vitals:   06/20/21 1358  BP: 122/82  Pulse: 78  Resp: 18  Temp: (!) 97.4 F (36.3 C)  TempSrc: Oral  SpO2: 98%  Weight: 166 lb (75.3 kg)  Height: 5\' 3"  (1.6 m)   Body mass index is 29.41 kg/m.  General:  WDWN in NAD; vital signs documented above Gait: Not observed HENT: WNL, normocephalic Pulmonary: normal non-labored breathing  Cardiac: regular HR,  Abdomen: soft, NT, small firm sub cu nodule LLQ Skin: without rashes Vascular Exam/Pulses: 2+ dorsalis pedis, posterior tibial pulses bilaterally Extremities: without varicose veins, without reticular veins, with edema, without stasis pigmentation, without lipodermatosclerosis, without ulcers. Left LE slight more edematous than right LE. No UE edema. Musculoskeletal: no muscle wasting or atrophy  Neurologic: A&O X 3;  No focal weakness or paresthesias are detected Psychiatric:  The pt has Normal affect.  Non-invasive Vascular Imaging   LLE Venous Insufficiency Duplex  Summary:   Left:  - No evidence of deep vein thrombosis seen in the left lower extremity,  from the common femoral through the popliteal veins.  - No evidence of superficial venous thrombosis in the left lower  extremity.  - Deep vein reflux in the CFV.  - Superficial vein reflux in the SFJ and GSV.     *See table(s) above for measurements and observations.     Preliminary    Medical Decision Making   Lyndle Pang is a 30 y.o. male who presents with: BLE edema consistent with lymphedema. Reflux is seen in the proximal portion of the left GSV and at the  SFJ.  No evidence of DVT or arterial insufficiency.  I do not appreciate upper extremity edema or chest/neck edema today.  I told the patient I had not diagnosed upper body lymphedema and am not aware of any appropriate imaging for this. I explained he may need to consider upgrading his lymphedema pumps as they are approximately 32 years old. I would think the likely explanation for his recurrent cellulitis is the edema state and not the compression hose themselves. He says he has not had cellulitis in about 3 years now. As the patient is somewhat complex, I will make a return visit to see Dr. Scot Dock.  The patient is interested in seeing someone with lymphedema expertise.  He is given written material regarding compression, elevation, exercise, weight control to improve LE edema. Thank you for allowing [place] to participate in this patient's care.   Barbie Banner, PA-C [facility] Office: [phone]  06/20/2021, 2:00 PM  Clinic MD: Dr. Stanford Breed

## 2021-07-05 ENCOUNTER — Encounter: Payer: Self-pay | Admitting: Vascular Surgery

## 2021-07-05 ENCOUNTER — Other Ambulatory Visit: Payer: Self-pay

## 2021-07-05 ENCOUNTER — Ambulatory Visit (INDEPENDENT_AMBULATORY_CARE_PROVIDER_SITE_OTHER): Payer: Self-pay | Admitting: Vascular Surgery

## 2021-07-05 VITALS — BP 103/68 | HR 74 | Temp 98.4°F | Resp 20 | Ht 63.0 in | Wt 168.0 lb

## 2021-07-05 DIAGNOSIS — I890 Lymphedema, not elsewhere classified: Secondary | ICD-10-CM

## 2021-07-05 NOTE — Progress Notes (Signed)
Patient ID: Charles Beck, male   DOB: Dec 14, 1990, 30 y.o.   MRN: [id number]  Reason for Consult: Follow-up   Referred by Luetta Nutting, DO  Subjective:     HPI:  Charles Beck is a 30 y.o. male recently evaluated for bilateral lower extremity lymphedema which is a chronic issue and was diagnosed many years ago.  Patient does not really wear compression stockings or use his lymphedema pumps that he was prescribed many years ago.  He really has edema limited to his feet which does not cause him much of an issue.  More recently he has swelling of his shoulders and in his axillae.  He also notes swelling in his right face particularly upon awakening.  He does not have any shortness of breath or difficulty swallowing with this.  He denies any previous upper extremity, chest or neck interventions.  He states that the swelling sometimes gets worse when he is driving which causes him to stop pull over the side of the road and that sometimes he has swelling when he is on a walk.  He does not notice any arm or hand edema.  Patient denies any history of DVT.  He is concerned that he has lymphedema that is spread to his upper extremities and has not had any evaluation for this.  Past Medical History:  Diagnosis Date   Anxiety attack    Cellulitis    Lymphedema    Phlebitis    Seizures ([place])    Family History  Problem Relation Age of Onset   Jaundice Mother    Past Surgical History:  Procedure Laterality Date   CATARACT EXTRACTION Left    TONSILLECTOMY AND ADENOIDECTOMY      Short Social History:  Social History   Tobacco Use   Smoking status: Never    Passive exposure: Never   Smokeless tobacco: Never  Substance Use Topics   Alcohol use: Yes    Alcohol/week: 1.0 standard drink    Types: 1 Glasses of wine per week    Allergies  Allergen Reactions   Doxycycline Shortness Of Breath   Keflex [Cephalexin] Shortness Of Breath   Banana Swelling and Other (See Comments)    Lips swell    Latex Swelling and Other (See Comments)    "Skin irritation," also   Other Itching, Swelling and Other (See Comments)    Melons - Lips and ears swell, but no breathing issues  Green tea - Body swells and dizziness, but no breathing issues  Insect bites/stings - Systemic inflammation and itching, also   Tape Other (See Comments)    Skin redness    Current Outpatient Medications  Medication Sig Dispense Refill   ibuprofen (ADVIL) 200 MG tablet Take 400 mg by mouth every 6 (six) hours as needed for mild pain.     levETIRAcetam (KEPPRA) 500 MG tablet Take 3 tablets twice a day 180 tablet 11   magnesium 30 MG tablet Take 30 mg by mouth daily.     Multiple Vitamin (STRESS FORMULA) TABS Take 1 tablet by mouth See admin instructions. Take 1 tablet by mouth one to two times a day     oxcarbazepine (TRILEPTAL) 600 MG tablet Take 1 tablet twice a day 60 tablet 11   No current facility-administered medications for this visit.    Review of Systems  Constitutional:  Constitutional negative. HENT:       Facial swelling as above Eyes: Eyes negative.  Respiratory: Respiratory negative.  Cardiovascular: Cardiovascular negative.  GI: Gastrointestinal negative.  Musculoskeletal:       Swelling of the shoulders and axilla Skin: Skin negative.  Neurological: Neurological negative. Hematologic: Hematologic/lymphatic negative.  Psychiatric: Psychiatric negative.       Objective:  Objective   Vitals:   07/05/21 0957  BP: 103/68  Pulse: 74  Resp: 20  Temp: 98.4 F (36.9 C)  SpO2: 96%  Weight: 168 lb (76.2 kg)  Height: 5\' 3"  (1.6 m)   Body mass index is 29.76 kg/m.  Physical Exam HENT:     Head: Normocephalic.     Nose:     Comments: Wearing a mask Eyes:     Pupils: Pupils are equal, round, and reactive to light.  Neck:     Comments: There are prominent veins of his bilateral shoulders, He does not appear to have any jugular venous distention Cardiovascular:     Rate and  Rhythm: Normal rate.     Pulses: Normal pulses.  Abdominal:     General: Abdomen is flat.     Palpations: Abdomen is soft.  Musculoskeletal:     Comments: Swelling limited to the bilateral feet  Skin:    Capillary Refill: Capillary refill takes less than 2 seconds.  Neurological:     General: No focal deficit present.     Mental Status: He is alert.  Psychiatric:        Mood and Affect: Mood normal.        Behavior: Behavior normal.        Thought Content: Thought content normal.    Data: No studies     Assessment/Plan:     30 year old male with history of chronic lymphedema bilateral lower extremities now with swelling of his right face and bilateral shoulders he is concern for lymphedema of these areas.  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  I discussed with the patient that we can continue to watch this for worsening symptoms versus proceed with CT venogram of the chest.  Patient is hopeful that this will resolve on its own but we will tentatively plan for CT venogram in 6 months.  Certainly if his symptoms worsen we can obtain this sooner.  Patient will call if this is the case otherwise we will follow-up in 6 months.     Waynetta Sandy MD [facility]

## 2021-08-16 ENCOUNTER — Ambulatory Visit: Payer: Medicaid Other | Admitting: Neurology

## 2021-08-17 ENCOUNTER — Ambulatory Visit: Payer: Medicaid Other | Admitting: Neurology

## 2021-12-13 ENCOUNTER — Encounter: Payer: Self-pay | Admitting: Family Medicine

## 2021-12-13 ENCOUNTER — Ambulatory Visit (INDEPENDENT_AMBULATORY_CARE_PROVIDER_SITE_OTHER): Payer: Self-pay | Admitting: Family Medicine

## 2021-12-13 VITALS — BP 113/75 | HR 61 | Temp 98.1°F | Ht 63.0 in | Wt 160.0 lb

## 2021-12-13 DIAGNOSIS — R350 Frequency of micturition: Secondary | ICD-10-CM

## 2021-12-13 DIAGNOSIS — L0390 Cellulitis, unspecified: Secondary | ICD-10-CM | POA: Insufficient documentation

## 2021-12-13 DIAGNOSIS — L03116 Cellulitis of left lower limb: Secondary | ICD-10-CM

## 2021-12-13 DIAGNOSIS — K0889 Other specified disorders of teeth and supporting structures: Secondary | ICD-10-CM

## 2021-12-13 LAB — POCT URINALYSIS DIP (CLINITEK)
Bilirubin, UA: NEGATIVE
Blood, UA: NEGATIVE
Glucose, UA: NEGATIVE mg/dL
Ketones, POC UA: NEGATIVE mg/dL
Leukocytes, UA: NEGATIVE
Nitrite, UA: NEGATIVE
POC PROTEIN,UA: NEGATIVE
Spec Grav, UA: 1.015 (ref 1.010–1.025)
Urobilinogen, UA: 0.2 E.U./dL
pH, UA: 6 (ref 5.0–8.0)

## 2021-12-13 MED ORDER — DOXYCYCLINE HYCLATE 100 MG PO TABS: 100.0000 mg | ORAL_TABLET | Freq: Two times a day (BID) | ORAL | 0 refills | Status: AC

## 2021-12-13 NOTE — Progress Notes (Signed)
?Charles Beck - 31 y.o. male MRN [id number]  Date of birth: 04-Jun-1991 ? ?Subjective ?Chief Complaint  ?Patient presents with  ? Fatigue  ? Urinary Tract Infection  ? Recurrent Skin Infections  ? Facial Swelling  ? ? ?HPI ?Charles Beck is a 31 y.o. male here today with complaint of pain in swelling on the right side of his jaw.  He reports that he has a couple teeth that are bothering him.  Currently does not have dental insurance but plans to have this taken care of over the next couple months.  He has not noted any difficulty breathing, swallowing, fever or chills. ? ?He has noted some redness and pain along his left upper thigh.  History of cellulitis in current symptoms feel similar.  He has not noted any significant swelling. ? ?ROS:  A comprehensive ROS was completed and negative except as noted per HPI ? ?Allergies  ?Allergen Reactions  ? Doxycycline Shortness Of Breath  ? Keflex [Cephalexin] Shortness Of Breath  ? Banana Swelling and Other (See Comments)  ?  Lips swell  ? Latex Swelling and Other (See Comments)  ?  "Skin irritation," also  ? Other Itching, Swelling and Other (See Comments)  ?  Melons - Lips and ears swell, but no breathing issues ? ?Green tea - Body swells and dizziness, but no breathing issues ? ?Insect bites/stings - Systemic inflammation and itching, also  ? Tape Other (See Comments)  ?  Skin redness  ? ? ?Past Medical History:  ?Diagnosis Date  ? Anxiety attack   ? Cellulitis   ? Lymphedema   ? Phlebitis   ? Seizures ([place])   ? ? ?Past Surgical History:  ?Procedure Laterality Date  ? CATARACT EXTRACTION Left   ? TONSILLECTOMY AND ADENOIDECTOMY    ? ? ?Social History  ? ?Socioeconomic History  ? Marital status: Married  ?  Spouse name: Not on file  ? Number of children: Not on file  ? Years of education: Not on file  ? Highest education level: Not on file  ?Occupational History  ? Not on file  ?Tobacco Use  ? Smoking status: Never  ?  Passive exposure: Never  ? Smokeless tobacco: Never   ?Vaping Use  ? Vaping Use: Never used  ?Substance and Sexual Activity  ? Alcohol use: Yes  ?  Alcohol/week: 1.0 standard drink  ?  Types: 1 Glasses of wine per week  ? Drug use: Never  ? Sexual activity: Yes  ?  Partners: Female  ?Other Topics Concern  ? Not on file  ?Social History Narrative  ? Right handed  ? 2 story home  ? Lives with family wife 4 kids  ? ?Social Determinants of Health  ? ?Financial Resource Strain: Not on file  ?Food Insecurity: Not on file  ?Transportation Needs: Not on file  ?Physical Activity: Not on file  ?Stress: Not on file  ?Social Connections: Not on file  ? ? ?Family History  ?Problem Relation Age of Onset  ? Jaundice Mother   ? ? ?Health Maintenance  ?Topic Date Due  ? COVID-19 Vaccine (1) Never done  ? HIV Screening  Never done  ? Hepatitis C Screening  Never done  ? TETANUS/TDAP  Never done  ? INFLUENZA VACCINE  03/06/2022  ? HPV VACCINES  Aged Out  ? ? ? ?----------------------------------------------------------------------------------------------------------------------------------------------------------------------------------------------------------------- ?Physical Exam ?BP 113/75 (BP Location: Left Arm, Patient Position: Sitting, Cuff Size: Normal)   Pulse 61   Temp 98.1 ?F (36.7 ?  C) (Oral)   Ht '5\' 3"'$  (1.6 m)   Wt 160 lb (72.6 kg)   SpO2 97%   BMI 28.34 kg/m?  ? ?Physical Exam ?Constitutional:   ?   Appearance: Normal appearance.  ?HENT:  ?   Head: Normocephalic and atraumatic.  ?   Mouth/Throat:  ?   Comments: Mild swelling along the jaw with submandibular lymphadenopathy.  There is a swelling along the lower gumline. ?Eyes:  ?   General: No scleral icterus. ?Cardiovascular:  ?   Rate and Rhythm: Normal rate and regular rhythm.  ?Pulmonary:  ?   Effort: Pulmonary effort is normal.  ?   Breath sounds: Normal breath sounds.  ?Musculoskeletal:  ?   Cervical back: Neck supple.  ?Neurological:  ?   Mental Status: He is alert.  ?Psychiatric:     ?   Mood and Affect: Mood  normal.     ?   Behavior: Behavior normal.  ? ? ?------------------------------------------------------------------------------------------------------------------------------------------------------------------------------------------------------------------- ?Assessment and Plan ? ?Cellulitis ?Starting doxycycline for treatment of cellulitis.  This should also cover his dental infection. ? ?Pain, dental ?Recommend treatment of problematic teeth.  We will treat acute infection with doxycycline as this will also cover current cellulitis.  He will let me know if not improving with this. ? ? ?Meds ordered this encounter  ?Medications  ? doxycycline (VIBRA-TABS) 100 MG tablet  ?  Sig: Take 1 tablet (100 mg total) by mouth 2 (two) times daily for 10 days.  ?  Dispense:  20 tablet  ?  Refill:  0  ?  Doesn't think previous allergy was to doxycycline. Willing to try again.  ? ? ?No follow-ups on file. ? ? ? ?This visit occurred during the SARS-CoV-2 public health emergency.  Safety protocols were in place, including screening questions prior to the visit, additional usage of staff PPE, and extensive cleaning of exam room while observing appropriate contact time as indicated for disinfecting solutions.  ? ?

## 2021-12-13 NOTE — Assessment & Plan Note (Signed)
Starting doxycycline for treatment of cellulitis.  This should also cover his dental infection. ?

## 2021-12-13 NOTE — Patient Instructions (Signed)
Start doxycycline.  Let me know if you have side effects.  ?Let me know if symptoms do not resolve with this.  ?

## 2021-12-13 NOTE — Assessment & Plan Note (Signed)
Recommend treatment of problematic teeth.  We will treat acute infection with doxycycline as this will also cover current cellulitis.  He will let me know if not improving with this. ?

## 2022-01-21 IMAGING — CT CT HEAD W/O CM
3 series · 14 of 47 positions shown, 16 images · non-contrast
Comparison: 04/10/2020, 03/13/2020

CLINICAL DATA: Sudden severe headache since [DATE] p.m., altered
level of consciousness

EXAM:
CT HEAD WITHOUT CONTRAST
TECHNIQUE: Contiguous axial images were obtained from the base of the skull
through the vertex without intravenous contrast.

[Series 2: head wo · axial · 0.47mm/px · z∈[+1536,+1686]mm · 8 of 36 slices shown, 10 images]
[im 3/36  brain]
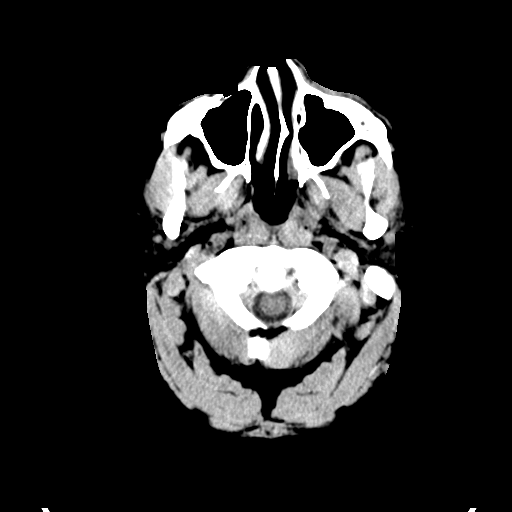
[im 3/36  bone]
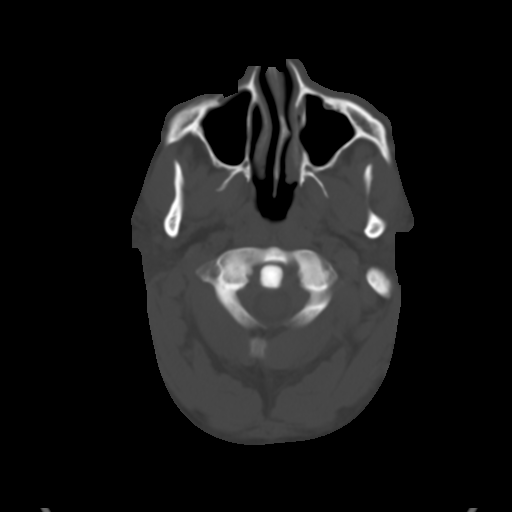
[im 8/36  brain]
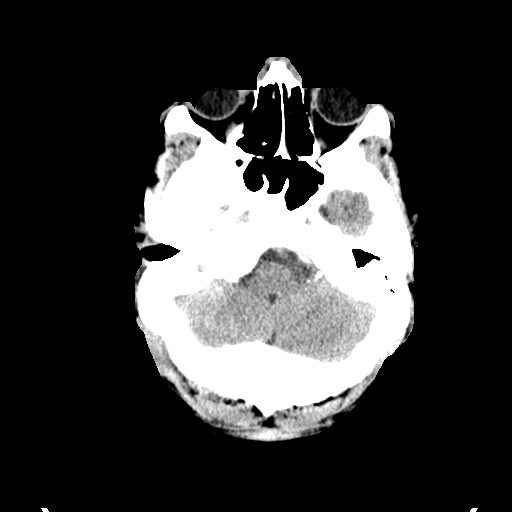
[im 11/36  brain]
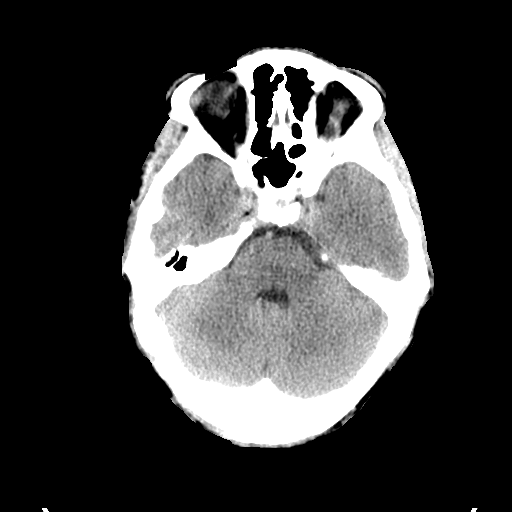
[im 16/36  brain]
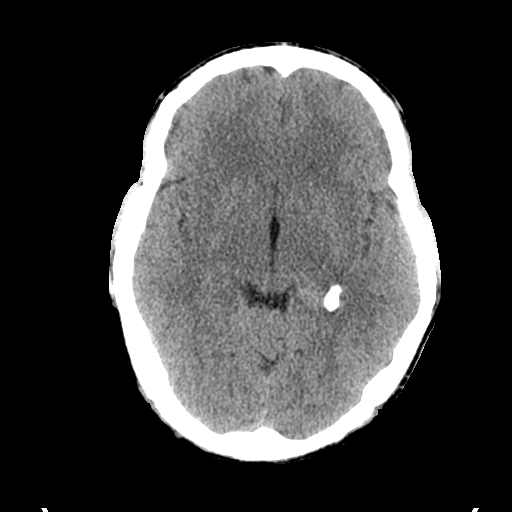
[im 20/36  brain]
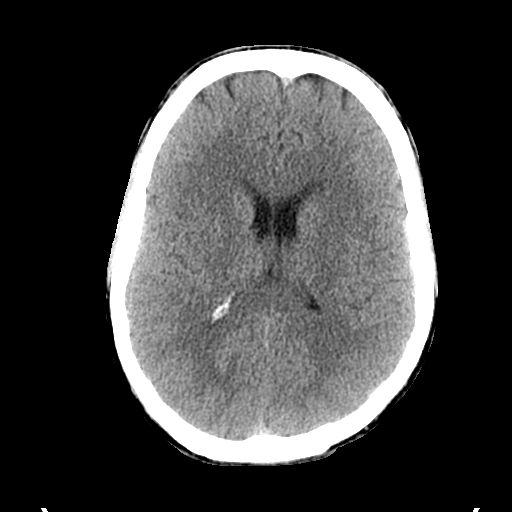
[im 20/36  bone]
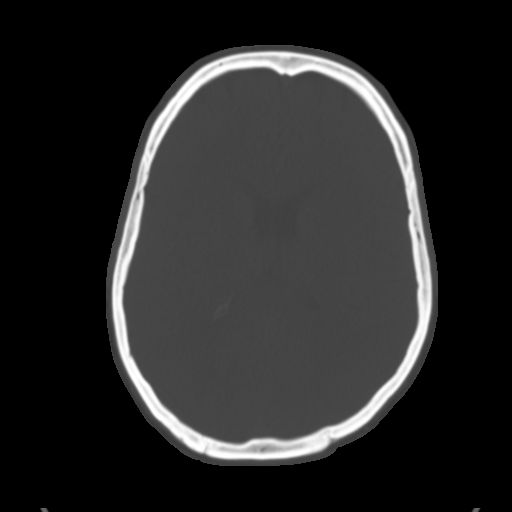
[im 25/36  brain]
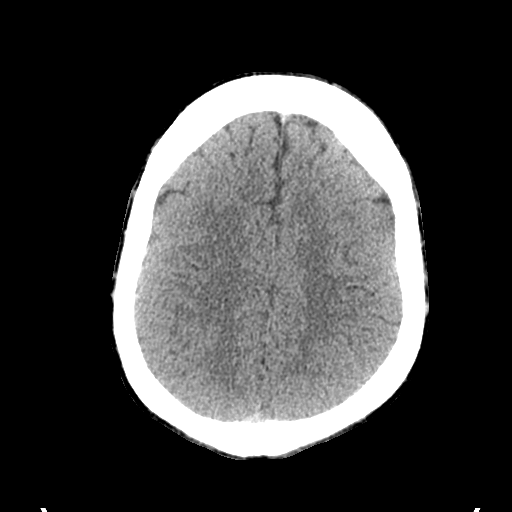
[im 28/36  brain]
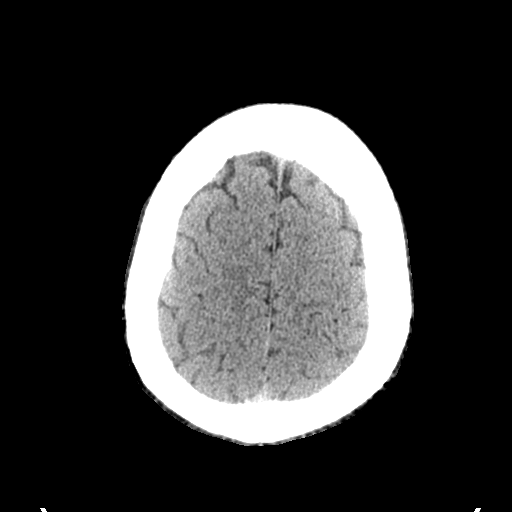
[im 33/36  brain]
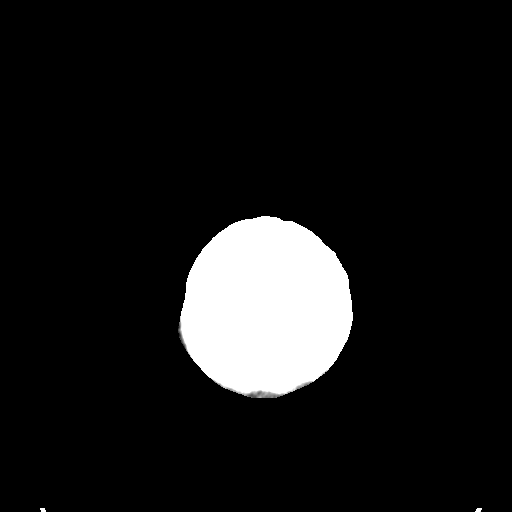

[Series 5: coronal soft tissue · coronal · 0.34mm/px · 3 of 72 slices shown]
[im 24/72  brain]
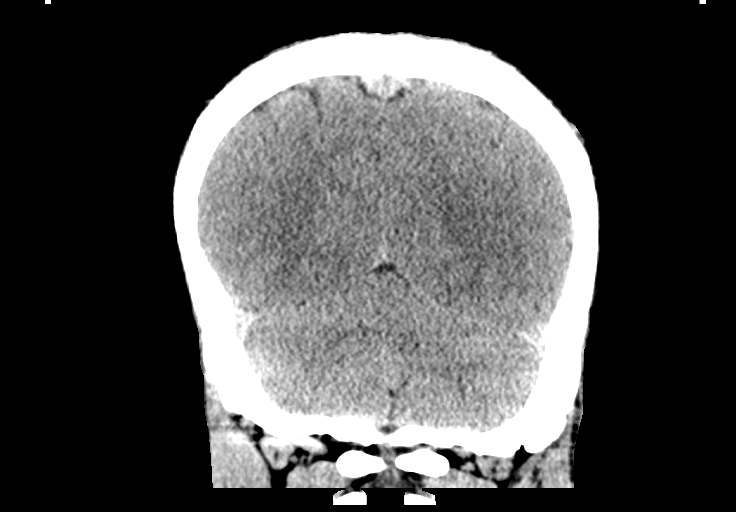
[im 32/72  brain]
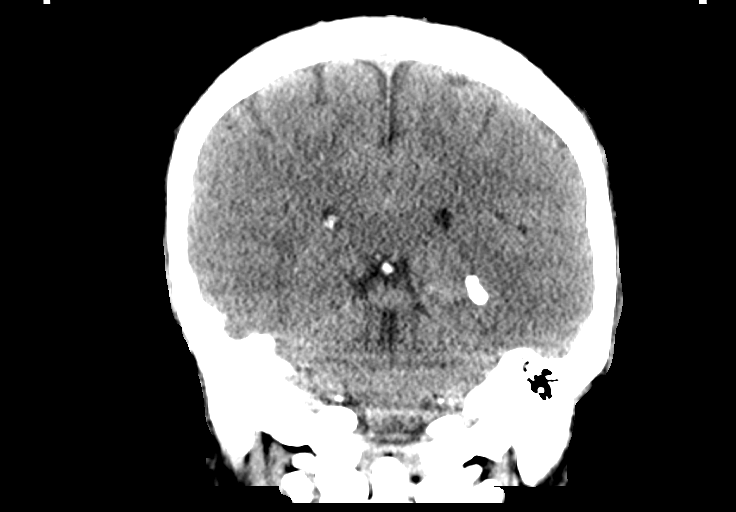
[im 40/72  brain]
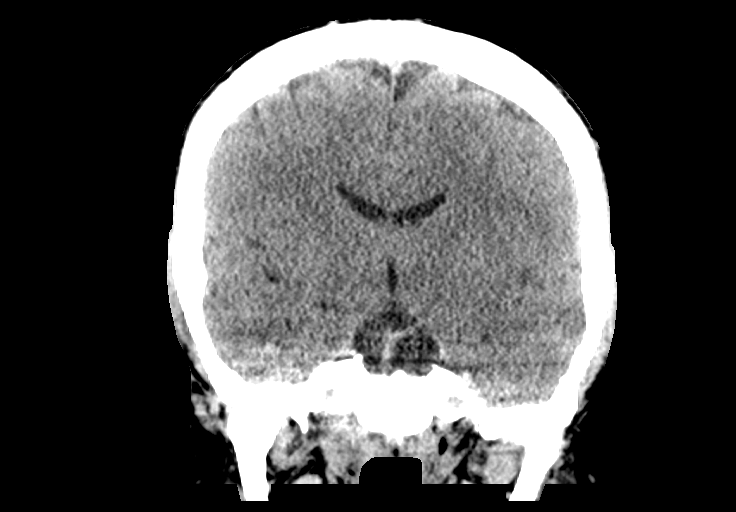

[Series 6: sagittal soft tissue · sagittal · 0.35mm/px · 3 of 54 slices shown]
[im 18/54  brain]
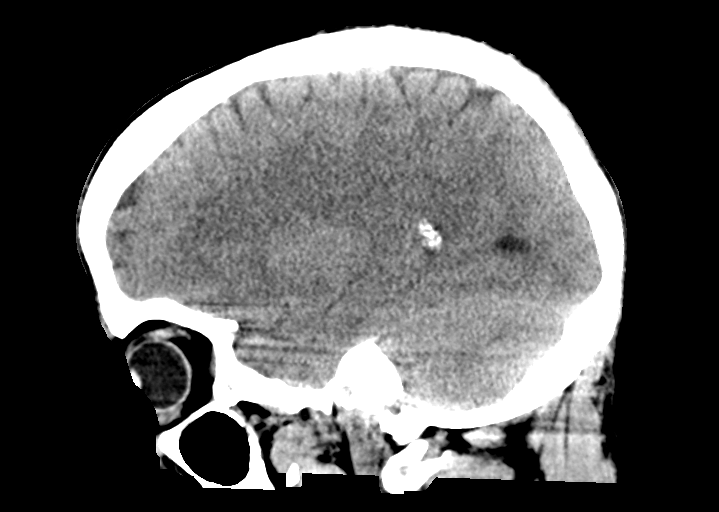
[im 27/54  brain]
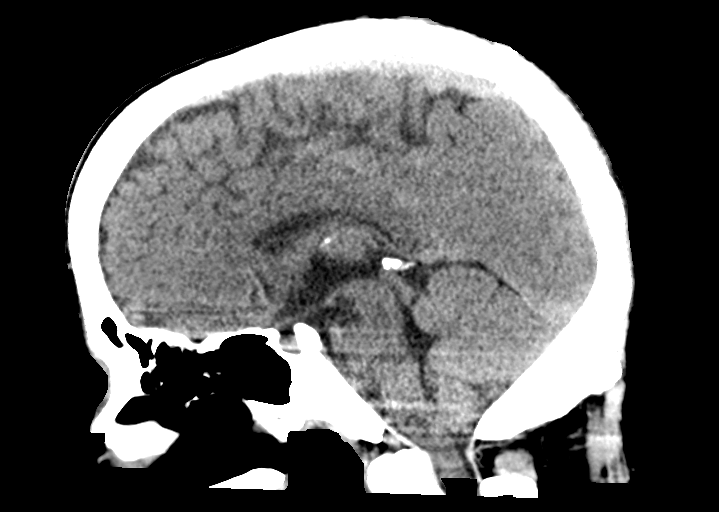
[im 36/54  brain]
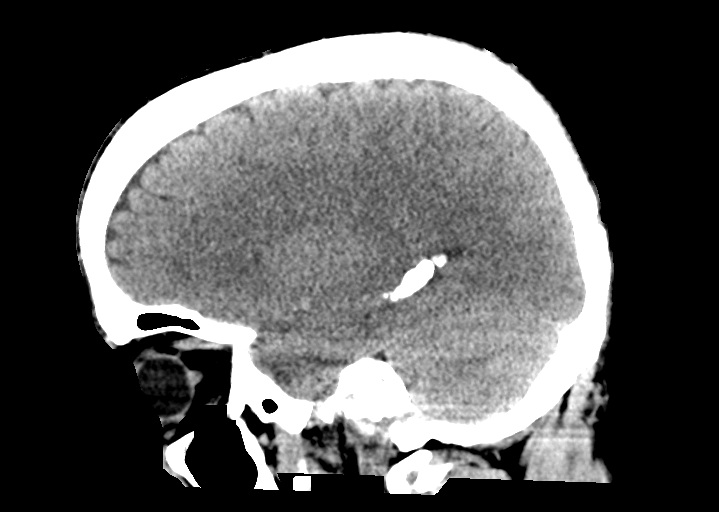

[14 of 47 positions shown; findings below may reference images not displayed]

FINDINGS: Brain: No acute infarct or hemorrhage. Lateral ventricles and
midline structures are unremarkable. There are no acute extra-axial
fluid collections. No mass effect. Stable hypodensity and
calcifications within the left mesial temporal region.

Vascular: No hyperdense vessel or unexpected calcification.

Skull: Normal. Negative for fracture or focal lesion.

Sinuses/Orbits: No acute finding.

Other: None.
IMPRESSION: 1. No acute intracranial process.
2. Stable CT findings of known left mesial temporal sclerosis with
associated calcifications.

## 2022-05-14 ENCOUNTER — Other Ambulatory Visit: Payer: Self-pay | Admitting: Neurology

## 2022-05-22 ENCOUNTER — Other Ambulatory Visit: Payer: Self-pay | Admitting: Neurology

## 2022-05-23 ENCOUNTER — Telehealth: Payer: Self-pay | Admitting: Neurology

## 2022-05-23 MED ORDER — LEVETIRACETAM 500 MG PO TABS: ORAL_TABLET | ORAL | 5 refills | Status: DC

## 2022-05-23 NOTE — Telephone Encounter (Signed)
1. Which medications need refilled? (List name and dosage, if known) keppra   2. Which pharmacy/location is medication to be sent to? (include street and city if local pharmacy) [facility] in [place]  3. Do they need a 30 day or 90 day supply? 90  Patient only has three days worth left.  He'd like to know if he needs to get this from his PCP or not?

## 2022-05-23 NOTE — Telephone Encounter (Signed)
Pt called no answer left a voice mail that refills have been sent in and that he has to keep his appointment to keep getting refills

## 2022-06-20 ENCOUNTER — Encounter: Payer: Self-pay | Admitting: Family Medicine

## 2022-06-20 ENCOUNTER — Ambulatory Visit: Payer: PRIVATE HEALTH INSURANCE | Admitting: Family Medicine

## 2022-06-20 VITALS — BP 110/70 | HR 77 | Wt 164.0 lb

## 2022-06-20 DIAGNOSIS — I890 Lymphedema, not elsewhere classified: Secondary | ICD-10-CM

## 2022-06-20 DIAGNOSIS — J0100 Acute maxillary sinusitis, unspecified: Secondary | ICD-10-CM

## 2022-06-20 DIAGNOSIS — R0600 Dyspnea, unspecified: Secondary | ICD-10-CM | POA: Diagnosis not present

## 2022-06-20 DIAGNOSIS — I872 Venous insufficiency (chronic) (peripheral): Secondary | ICD-10-CM | POA: Diagnosis not present

## 2022-06-20 MED ORDER — SULFAMETHOXAZOLE-TRIMETHOPRIM 800-160 MG PO TABS: 1.0000 | ORAL_TABLET | Freq: Two times a day (BID) | ORAL | 0 refills | Status: DC

## 2022-06-21 LAB — CBC WITH DIFFERENTIAL/PLATELET
Absolute Monocytes: 648 cells/uL (ref 200–950)
Basophils Absolute: 73 cells/uL (ref 0–200)
Basophils Relative: 0.9 %
Eosinophils Absolute: 154 cells/uL (ref 15–500)
Eosinophils Relative: 1.9 %
HCT: 40.4 % (ref 38.5–50.0)
Hemoglobin: 14 g/dL (ref 13.2–17.1)
Lymphs Abs: 2462 cells/uL (ref 850–3900)
MCH: 30.7 pg (ref 27.0–33.0)
MCHC: 34.7 g/dL (ref 32.0–36.0)
MCV: 88.6 fL (ref 80.0–100.0)
MPV: 10.4 fL (ref 7.5–12.5)
Monocytes Relative: 8 %
Neutro Abs: 4763 cells/uL (ref 1500–7800)
Neutrophils Relative %: 58.8 %
Platelets: 347 10*3/uL (ref 140–400)
RBC: 4.56 10*6/uL (ref 4.20–5.80)
RDW: 12.1 % (ref 11.0–15.0)
Total Lymphocyte: 30.4 %
WBC: 8.1 10*3/uL (ref 3.8–10.8)

## 2022-06-21 LAB — COMPLETE METABOLIC PANEL WITH GFR
AG Ratio: 2 (calc) (ref 1.0–2.5)
ALT: 15 U/L (ref 9–46)
AST: 12 U/L (ref 10–40)
Albumin: 4.8 g/dL (ref 3.6–5.1)
Alkaline phosphatase (APISO): 61 U/L (ref 36–130)
BUN: 14 mg/dL (ref 7–25)
CO2: 28 mmol/L (ref 20–32)
Calcium: 9.5 mg/dL (ref 8.6–10.3)
Chloride: 99 mmol/L (ref 98–110)
Creat: 0.89 mg/dL (ref 0.60–1.26)
Globulin: 2.4 g/dL (calc) (ref 1.9–3.7)
Glucose, Bld: 109 mg/dL — ABNORMAL HIGH (ref 65–99)
Potassium: 4.3 mmol/L (ref 3.5–5.3)
Sodium: 138 mmol/L (ref 135–146)
Total Bilirubin: 0.3 mg/dL (ref 0.2–1.2)
Total Protein: 7.2 g/dL (ref 6.1–8.1)
eGFR: 116 mL/min/{1.73_m2} (ref >=60)

## 2022-06-21 LAB — BRAIN NATRIURETIC PEPTIDE: Brain Natriuretic Peptide: 11 pg/mL (ref <100)

## 2022-06-21 LAB — TSH: TSH: 2.3 mIU/L (ref 0.40–4.50)

## 2022-06-24 DIAGNOSIS — J320 Chronic maxillary sinusitis: Secondary | ICD-10-CM | POA: Insufficient documentation

## 2022-06-24 DIAGNOSIS — R0600 Dyspnea, unspecified: Secondary | ICD-10-CM | POA: Insufficient documentation

## 2022-06-24 NOTE — Assessment & Plan Note (Addendum)
Treated with course of Bactrim due to multiple medication allergies.

## 2022-06-24 NOTE — Progress Notes (Signed)
Alvester Eads - 31 y.o. male MRN [id number]  Date of birth: 1991/03/05  Subjective Chief Complaint  Patient presents with   Shortness of Breath    Light headed and heavy breathing    HPI Rocko Fesperman is a 31 y.o. male here today increased swelling in his upper and lower extremities.  He has been seeing physical therapy and lymphedema clinic to help with swelling in lower extremities.  He has noted more swelling in his upper extremities as well as right base recently.  He is requesting referral to OT to help with this.  Additionally, he is having some increased sinus pain and pressure that is bilateral, right greater than left.  He has not had any cough or wheezing.  He has felt a little more short of breath recently.  Denies chest pain or palpitations.  ROS:  A comprehensive ROS was completed and negative except as noted per HPI  Allergies  Allergen Reactions   Doxycycline Shortness Of Breath   Keflex [Cephalexin] Shortness Of Breath   Banana Swelling and Other (See Comments)    Lips swell   Latex Swelling and Other (See Comments)    "Skin irritation," also   Other Itching, Swelling and Other (See Comments)    Melons - Lips and ears swell, but no breathing issues  Green tea - Body swells and dizziness, but no breathing issues  Insect bites/stings - Systemic inflammation and itching, also   Tape Other (See Comments)    Skin redness    Past Medical History:  Diagnosis Date   Anxiety attack    Cellulitis    Lymphedema    Phlebitis    Seizures (HCC)     Past Surgical History:  Procedure Laterality Date   CATARACT EXTRACTION Left    TONSILLECTOMY AND ADENOIDECTOMY      Social History   Socioeconomic History   Marital status: Married    Spouse name: Not on file   Number of children: Not on file   Years of education: Not on file   Highest education level: Not on file  Occupational History   Not on file  Tobacco Use   Smoking status: Never    Passive exposure:  Never   Smokeless tobacco: Never  Vaping Use   Vaping Use: Never used  Substance and Sexual Activity   Alcohol use: Yes    Alcohol/week: 1.0 standard drink of alcohol    Types: 1 Glasses of wine per week   Drug use: Never   Sexual activity: Yes    Partners: Female  Other Topics Concern   Not on file  Social History Narrative   Right handed   2 story home   Lives with family wife 4 kids   Social Determinants of Health   Financial Resource Strain: Not on file  Food Insecurity: Not on file  Transportation Needs: Not on file  Physical Activity: Not on file  Stress: Not on file  Social Connections: Not on file    Family History  Problem Relation Age of Onset   Jaundice Mother     Health Maintenance  Topic Date Due   HIV Screening  Never done   Hepatitis C Screening  Never done   COVID-19 Vaccine (1) 07/06/2022 (Originally 06/14/1991)   INFLUENZA VACCINE  11/04/2022 (Originally 03/06/2022)   HPV VACCINES  Aged Out     ----------------------------------------------------------------------------------------------------------------------------------------------------------------------------------------------------------------- Physical Exam BP 110/70 (BP Location: Left Arm, Patient Position: Sitting, Cuff Size: Normal)   Pulse 77   Wt  164 lb (74.4 kg)   SpO2 100%   BMI 29.05 kg/m   Physical Exam Constitutional:      Appearance: He is well-developed.  HENT:     Head: Normocephalic and atraumatic.  Eyes:     General: No scleral icterus. Cardiovascular:     Rate and Rhythm: Normal rate and regular rhythm.  Pulmonary:     Effort: Pulmonary effort is normal.     Breath sounds: Normal breath sounds.     Comments: Trace edema in all extremities.  Mild puffiness along the right side of the face.  He does have tenderness along the maxillary sinuses bilaterally. Neurological:     Mental Status: He is alert.      ------------------------------------------------------------------------------------------------------------------------------------------------------------------------------------------------------------------- Assessment and Plan  Chronic acquired lymphedema Referral placed to OT for additional therapy for lymphedema.  Ordered to cover both upper and lower extremities.  Maxillary sinusitis Treated with course of Bactrim due to multiple medication allergies.  Dyspnea Has noted increased dyspnea.  May be related to sinusitis.  Will check BNP with his swelling he has noted.   Meds ordered this encounter  Medications   sulfamethoxazole-trimethoprim (BACTRIM DS) 800-160 MG tablet    Sig: Take 1 tablet by mouth 2 (two) times daily.    Dispense:  20 tablet    Refill:  0    No follow-ups on file.    This visit occurred during the SARS-CoV-2 public health emergency.  Safety protocols were in place, including screening questions prior to the visit, additional usage of staff PPE, and extensive cleaning of exam room while observing appropriate contact time as indicated for disinfecting solutions.

## 2022-06-24 NOTE — Assessment & Plan Note (Signed)
Referral placed to OT for additional therapy for lymphedema.  Ordered to cover both upper and lower extremities.

## 2022-06-24 NOTE — Assessment & Plan Note (Signed)
Has noted increased dyspnea.  May be related to sinusitis.  Will check BNP with his swelling he has noted.

## 2022-07-31 ENCOUNTER — Other Ambulatory Visit: Payer: Self-pay | Admitting: Neurology

## 2022-10-19 ENCOUNTER — Emergency Department (HOSPITAL_BASED_OUTPATIENT_CLINIC_OR_DEPARTMENT_OTHER)
Admission: EM | Admit: 2022-10-19 | Discharge: 2022-10-19 | Disposition: A | Discharge status: Home / Self Care | Payer: 59 | Attending: Emergency Medicine | Admitting: Emergency Medicine

## 2022-10-19 ENCOUNTER — Emergency Department (HOSPITAL_BASED_OUTPATIENT_CLINIC_OR_DEPARTMENT_OTHER): Payer: 59

## 2022-10-19 ENCOUNTER — Encounter (HOSPITAL_BASED_OUTPATIENT_CLINIC_OR_DEPARTMENT_OTHER): Payer: Self-pay | Admitting: Emergency Medicine

## 2022-10-19 ENCOUNTER — Other Ambulatory Visit: Payer: Self-pay

## 2022-10-19 DIAGNOSIS — Z91040 Latex allergy status: Secondary | ICD-10-CM | POA: Insufficient documentation

## 2022-10-19 DIAGNOSIS — R42 Dizziness and giddiness: Secondary | ICD-10-CM

## 2022-10-19 DIAGNOSIS — I2510 Atherosclerotic heart disease of native coronary artery without angina pectoris: Secondary | ICD-10-CM | POA: Diagnosis not present

## 2022-10-19 DIAGNOSIS — R519 Headache, unspecified: Secondary | ICD-10-CM | POA: Insufficient documentation

## 2022-10-19 DIAGNOSIS — R079 Chest pain, unspecified: Secondary | ICD-10-CM | POA: Diagnosis present

## 2022-10-19 DIAGNOSIS — R0789 Other chest pain: Secondary | ICD-10-CM | POA: Diagnosis not present

## 2022-10-19 LAB — CBC
HCT: 38.4 % — ABNORMAL LOW (ref 39.0–52.0)
Hemoglobin: 13.8 g/dL (ref 13.0–17.0)
MCH: 30.7 pg (ref 26.0–34.0)
MCHC: 35.9 g/dL (ref 30.0–36.0)
MCV: 85.3 fL (ref 80.0–100.0)
Platelets: 315 10*3/uL (ref 150–400)
RBC: 4.5 MIL/uL (ref 4.22–5.81)
RDW: 11.9 % (ref 11.5–15.5)
WBC: 6.8 10*3/uL (ref 4.0–10.5)
nRBC: 0 % (ref 0.0–0.2)

## 2022-10-19 LAB — BASIC METABOLIC PANEL
Anion gap: 9 (ref 5–15)
BUN: 11 mg/dL (ref 6–20)
CO2: 28 mmol/L (ref 22–32)
Calcium: 9 mg/dL (ref 8.9–10.3)
Chloride: 98 mmol/L (ref 98–111)
Creatinine, Ser: 0.78 mg/dL (ref 0.61–1.24)
GFR, Estimated: >60 mL/min (ref >60)
Glucose, Bld: 134 mg/dL — ABNORMAL HIGH (ref 70–99)
Potassium: 3.7 mmol/L (ref 3.5–5.1)
Sodium: 135 mmol/L (ref 135–145)

## 2022-10-19 LAB — TROPONIN I (HIGH SENSITIVITY)
Troponin I (High Sensitivity): 3 ng/L (ref <18)
Troponin I (High Sensitivity): 3 ng/L (ref <18)

## 2022-10-19 MED ORDER — NAPROXEN 375 MG PO TABS: 375.0000 mg | ORAL_TABLET | Freq: Two times a day (BID) | ORAL | 0 refills | Status: DC

## 2022-10-19 MED ORDER — ASPIRIN 81 MG PO CHEW
324.0000 mg | CHEWABLE_TABLET | Freq: Once | ORAL | Status: AC
  Administered 2022-10-19: 324 mg via ORAL
  Filled 2022-10-19: qty 4

## 2022-10-19 MED ORDER — OMEPRAZOLE 20 MG PO CPDR: 20.0000 mg | DELAYED_RELEASE_CAPSULE | Freq: Every day | ORAL | 0 refills | Status: DC

## 2022-10-19 NOTE — ED Notes (Signed)
Patient wants to have blood taken out from his IV for CBG.

## 2022-10-19 NOTE — ED Notes (Signed)
Pt discharged to home. Discharge instructions have been discussed with patient and/or family members. Pt verbally acknowledges understanding d/c instructions, and endorses comprehension to checkout at registration before leaving.  °

## 2022-10-19 NOTE — Discharge Instructions (Signed)
Take the medications as prescribed to help with your chest discomfort.  Follow-up with your primary care doctor to be rechecked.  Return as needed for worsening symptoms.

## 2022-10-19 NOTE — ED Notes (Signed)
First contact with patient. Pt arrived via triage from home with c/o HA. Pt. denies shob, is A&OX 4, resp. even/unlabored. Pt placed into gown and on cardiac monitor, call light within reach. Patient updated on plan of care. Will continue to monitor patient.

## 2022-10-19 NOTE — ED Triage Notes (Signed)
Pt states he has been having a headache with dizziness and focusing since that has worsening for 3-4 days.    Pt also concerned that he has been having chest pain for last several days.

## 2022-10-19 NOTE — ED Provider Notes (Signed)
[facility] Provider Note   CSN: [id number] Arrival date & time: 10/19/22  [id number]     History  Chief Complaint  Patient presents with   Headache   Dizziness   Chest Pain    Charles Beck is a 32 y.o. male.   Headache Associated symptoms: dizziness   Dizziness Associated symptoms: chest pain and headaches   Chest Pain Associated symptoms: dizziness and headache    Patient does have history of vitamin D deficiency, seizure disorder, chronic lymphedema and chronic venous insufficiency.  Patient states he started having chest discomfort for the last several days.  He initially thought he was going to come down with a cold as he feels a discomfort and congestion in his chest.  He is not having any nausea no diaphoresis no radiation of the pain.  Patient does not have any history of coronary artery disease.  Patient states he called his doctor was instructed to come to the ED.  Patient does have chronic lymphedema but has not noticed any changes with that Patient also states he has been having trouble with increasing headache and dizziness.  Patient states previously he was told he had swelling on a CAT scan and that he should have repeat CT scans.  Patient states he does see a neurologist for his seizures but does not have an appointment scheduled.  He notices the headache and to become more severe when he is lying flat.  He denies any double with his speech.  No focal numbness or weakness.  Home Medications Prior to Admission medications   Medication Sig Start Date End Date Taking? Authorizing Provider  naproxen (NAPROSYN) 375 MG tablet Take 1 tablet (375 mg total) by mouth 2 (two) times daily. 10/19/22  Yes Dorie Rank, MD  omeprazole (PRILOSEC) 20 MG capsule Take 1 capsule (20 mg total) by mouth daily for 10 days. 10/19/22 10/29/22 Yes Dorie Rank, MD  ibuprofen (ADVIL) 200 MG tablet Take 400 mg by mouth every 6 (six) hours as needed for mild  pain. Patient not taking: Reported on 06/20/2022    [provider]  levETIRAcetam (KEPPRA) 500 MG tablet Take 3 tablets twice a day 05/23/22   Cameron Sprang, MD  magnesium 30 MG tablet Take 30 mg by mouth daily.    [provider]  Multiple Vitamin (STRESS FORMULA) TABS Take 1 tablet by mouth See admin instructions. Take 1 tablet by mouth one to two times a day Patient not taking: Reported on 06/20/2022    [provider]  oxcarbazepine (TRILEPTAL) 600 MG tablet TAKE ONE TABLET BY MOUTH TWICE A DAY 07/31/22   Cameron Sprang, MD  sulfamethoxazole-trimethoprim (BACTRIM DS) 800-160 MG tablet Take 1 tablet by mouth 2 (two) times daily. 06/20/22   Luetta Nutting, DO      Allergies    Doxycycline, Keflex [cephalexin], Banana, Latex, Other, and Tape    Review of Systems   Review of Systems  Cardiovascular:  Positive for chest pain.  Neurological:  Positive for dizziness and headaches.    Physical Exam Updated Vital Signs BP 118/74   Pulse 79   Temp (S) 97.9 F (36.6 C) (Oral) Comment: drinking water  Resp 18   Ht 1.6 m (5\' 3" )   Wt 73.5 kg   SpO2 98%   BMI 28.70 kg/m  Physical Exam Vitals and nursing note reviewed.  Constitutional:      General: He is not in acute distress.  Appearance: He is well-developed.  HENT:     Head: Normocephalic and atraumatic.     Right Ear: External ear normal.     Left Ear: External ear normal.  Eyes:     General: No scleral icterus.       Right eye: No discharge.        Left eye: No discharge.     Conjunctiva/sclera: Conjunctivae normal.  Neck:     Trachea: No tracheal deviation.  Cardiovascular:     Rate and Rhythm: Normal rate and regular rhythm.  Pulmonary:     Effort: Pulmonary effort is normal. No respiratory distress.     Breath sounds: Normal breath sounds. No stridor. No wheezing or rales.  Abdominal:     General: Bowel sounds are normal. There is no distension.     Palpations: Abdomen is soft.      Tenderness: There is no abdominal tenderness. There is no guarding or rebound.  Musculoskeletal:        General: No tenderness or deformity.     Cervical back: Normal range of motion and neck supple.     Right lower leg: Edema present.     Left lower leg: Edema present.  Skin:    General: Skin is warm and dry.     Findings: No rash.  Neurological:     General: No focal deficit present.     Mental Status: He is alert.     Cranial Nerves: No cranial nerve deficit, dysarthria or facial asymmetry.     Sensory: No sensory deficit.     Motor: No abnormal muscle tone or seizure activity.     Coordination: Coordination normal.  Psychiatric:        Mood and Affect: Mood normal.     ED Results / Procedures / Treatments   Labs (all labs ordered are listed, but only abnormal results are displayed) Labs Reviewed  BASIC METABOLIC PANEL - Abnormal; Notable for the following components:      Result Value   Glucose, Bld 134 (*)    All other components within normal limits  CBC - Abnormal; Notable for the following components:   HCT 38.4 (*)    All other components within normal limits  CBG MONITORING, ED  TROPONIN I (HIGH SENSITIVITY)  TROPONIN I (HIGH SENSITIVITY)    EKG EKG Interpretation  Date/Time:  Friday October 19 2022 09:35:15 EDT Ventricular Rate:  75 PR Interval:  131 QRS Duration: 97 QT Interval:  350 QTC Calculation: 391 R Axis:   67 Text Interpretation: Sinus rhythm Borderline repolarization abnormality No significant change since last tracing Confirmed by Dorie Rank [phone]) on 10/19/2022 9:44:04 AM  Radiology DG Chest Portable 1 View  Result Date: 10/19/2022 CLINICAL DATA:  Chest pain. EXAM: PORTABLE CHEST 1 VIEW COMPARISON:  Chest x-ray dated July 12, 2019. FINDINGS: The heart size and mediastinal contours are within normal limits. Both lungs are clear. The visualized skeletal structures are unremarkable. IMPRESSION: No active disease. Electronically Signed   By:  Titus Dubin M.D.   On: 10/19/2022 10:30   CT Head Wo Contrast  Result Date: 10/19/2022 CLINICAL DATA:  Headache and dizziness for the past 3-4 days. EXAM: CT HEAD WITHOUT CONTRAST TECHNIQUE: Contiguous axial images were obtained from the base of the skull through the vertex without intravenous contrast. RADIATION DOSE REDUCTION: This exam was performed according to the departmental dose-optimization program which includes automated exposure control, adjustment of the mA and/or kV according to patient size and/or use  of iterative reconstruction technique. COMPARISON:  CT head dated March 09, 2021. FINDINGS: Brain: No evidence of acute infarction, hemorrhage, hydrocephalus, extra-axial collection or mass lesion/mass effect. Unchanged hypodensity and calcifications within the left mesial temporal region and adjacent cord plexus. Vascular: No hyperdense vessel or unexpected calcification. Skull: Normal. Negative for fracture or focal lesion. Sinuses/Orbits: No acute finding. Other: None. IMPRESSION: 1. No acute intracranial abnormality. 2. Unchanged findings associated with left mesial temporal sclerosis. Electronically Signed   By: Titus Dubin M.D.   On: 10/19/2022 10:29    Procedures Procedures    Medications Ordered in ED Medications  aspirin chewable tablet 324 mg (324 mg Oral Given 10/19/22 1109)    ED Course/ Medical Decision Making/ A&P Clinical Course as of 10/19/22 1509  Fri Oct 19, 2022  1005 Prior CT scan findings reviewed in August 2022.  Patient had no acute findings and there were stable findings of known left mesial temporal sclerosis with calcifications [JK]  [id number] CBC metabolic panel troponin normal [JK]  1158 Head CT and chest x-ray without acute findings [JK]    Clinical Course User Index [JK] Dorie Rank, MD                             Medical Decision Making Problems Addressed: Atypical chest pain: acute illness or injury that poses a threat to life or bodily  functions Dizziness: acute illness or injury that poses a threat to life or bodily functions  Amount and/or Complexity of Data Reviewed Labs: ordered. Radiology: ordered.  Risk OTC drugs. Prescription drug management.  Patient presented to the ED for evaluation of chest discomfort as well as dizziness.  ED workup reassuring.  Low risk heart score.  No signs of acute coronary syndrome.  Patient low risk for PE PERC negative.  Will try course of antacids and NSAIDs.  Discussed outpatient follow-up PCP.  Patient also complained of dizziness and mentioned he had swelling noted on a prior CT.  I was concerned whether this was hydrocephalus or less likely cerebral edema.  CT scan was performed and x-ray shows findings of chronic sclerosis but no evidence of edema or hydrocephalus.  No focal neurologic deficits.  Patient appears stable to follow-up with his neurologist as an outpatient.  Evaluation and diagnostic testing in the emergency department does not suggest an emergent condition requiring admission or immediate intervention beyond what has been performed at this time.  The patient is safe for discharge and has been instructed to return immediately for worsening symptoms, change in symptoms or any other concerns.         Final Clinical Impression(s) / ED Diagnoses Final diagnoses:  Atypical chest pain  Dizziness    Rx / DC Orders ED Discharge Orders          Ordered    omeprazole (PRILOSEC) 20 MG capsule  Daily        10/19/22 1502    naproxen (NAPROSYN) 375 MG tablet  2 times daily        10/19/22 1502              Dorie Rank, MD 10/19/22 [phone]

## 2022-10-22 ENCOUNTER — Telehealth: Payer: Self-pay | Admitting: General Practice

## 2022-10-22 NOTE — Transitions of Care (Post Inpatient/ED Visit) (Signed)
   10/22/2022  Name: Charles Beck MRN: [id number] DOB: Jul 06, 1991  Today's TOC FU Call Status: Today's TOC FU Call Status:: Unsuccessul Call (1st Attempt) Unsuccessful Call (1st Attempt) Date: 10/22/22  Attempted to reach the patient regarding the most recent Inpatient/ED visit.  Follow Up Plan: Additional outreach attempts will be made to reach the patient to complete the Transitions of Care (Post Inpatient/ED visit) call.   Signature Tinnie Gens, RN BSN

## 2022-10-25 NOTE — Transitions of Care (Post Inpatient/ED Visit) (Signed)
   10/25/2022  Name: Charles Beck MRN: [id number] DOB: 06-01-1991  Today's TOC FU Call Status: Today's TOC FU Call Status:: Unsuccessful Call (2nd Attempt) Unsuccessful Call (1st Attempt) Date: 10/22/22 Unsuccessful Call (2nd Attempt) Date: 10/25/22  Attempted to reach the patient regarding the most recent Inpatient/ED visit.  Follow Up Plan: Additional outreach attempts will be made to reach the patient to complete the Transitions of Care (Post Inpatient/ED visit) call.   Signature Tinnie Gens, RN BSN

## 2022-10-26 NOTE — Transitions of Care (Post Inpatient/ED Visit) (Signed)
   10/26/2022  Name: Charles Beck MRN: [id number] DOB: 02/04/1991  Today's TOC FU Call Status: Today's TOC FU Call Status:: Unsuccessful Call (3rd Attempt) Unsuccessful Call (1st Attempt) Date: 10/22/22 Unsuccessful Call (2nd Attempt) Date: 10/25/22 Unsuccessful Call (3rd Attempt) Date: 10/26/22  Attempted to reach the patient regarding the most recent Inpatient/ED visit.  Follow Up Plan: No further outreach attempts will be made at this time. We have been unable to contact the patient.  Signature Tinnie Gens, RN BSN

## 2022-10-27 ENCOUNTER — Other Ambulatory Visit: Payer: Self-pay | Admitting: Neurology

## 2022-11-09 ENCOUNTER — Telehealth: Payer: Self-pay | Admitting: Anesthesiology

## 2022-11-09 MED ORDER — OXCARBAZEPINE 600 MG PO TABS: ORAL_TABLET | ORAL | 0 refills | Status: DC

## 2022-11-09 NOTE — Telephone Encounter (Signed)
Pt is requesting a refill on his medication  1. Which medications need refilled? Oxcarbazepine 600 mg   2. Which pharmacy/location is medication to be sent to? [facility] [address]  3. Do they need a 30 day or 90 day supply? 90 day supply

## 2022-11-16 ENCOUNTER — Other Ambulatory Visit: Payer: Self-pay | Admitting: Neurology

## 2022-12-11 ENCOUNTER — Ambulatory Visit: Payer: PRIVATE HEALTH INSURANCE | Admitting: Neurology

## 2023-02-01 ENCOUNTER — Other Ambulatory Visit: Payer: Self-pay | Admitting: Neurology

## 2023-03-31 ENCOUNTER — Other Ambulatory Visit: Payer: Self-pay | Admitting: Neurology

## 2023-05-16 ENCOUNTER — Other Ambulatory Visit: Payer: Self-pay | Admitting: Neurology

## 2023-05-24 ENCOUNTER — Encounter: Payer: Self-pay | Admitting: Neurology

## 2023-05-24 ENCOUNTER — Ambulatory Visit: Payer: Medicaid Other | Admitting: Neurology

## 2023-05-24 VITALS — BP 107/69 | HR 78 | Ht 63.0 in | Wt 164.4 lb

## 2023-05-24 DIAGNOSIS — R29898 Other symptoms and signs involving the musculoskeletal system: Secondary | ICD-10-CM | POA: Diagnosis not present

## 2023-05-24 DIAGNOSIS — M25511 Pain in right shoulder: Secondary | ICD-10-CM | POA: Diagnosis not present

## 2023-05-24 DIAGNOSIS — G40009 Localization-related (focal) (partial) idiopathic epilepsy and epileptic syndromes with seizures of localized onset, not intractable, without status epilepticus: Secondary | ICD-10-CM | POA: Diagnosis not present

## 2023-05-24 MED ORDER — OXCARBAZEPINE 600 MG PO TABS: ORAL_TABLET | ORAL | 3 refills | Status: DC

## 2023-05-24 MED ORDER — LEVETIRACETAM 500 MG PO TABS: ORAL_TABLET | ORAL | 3 refills | Status: DC
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
## 2023-05-24 NOTE — Patient Instructions (Signed)
Good to see you!  Continue Levetiracetam (Keppra) 500mg : Take 3 tablets twice a day  2. Continue Oxcarbazepine (Trileptal) 600mg : Take 1 tablet twice a day  3. Referral will be sent for Physical Therapy  4. Follow-up in 1 year, call for any changes   Seizure Precautions: 1. If medication has been prescribed for you to prevent seizures, take it exactly as directed.  Do not stop taking the medicine without talking to your doctor first, even if you have not had a seizure in a long time.   2. Avoid activities in which a seizure would cause danger to yourself or to others.  Don't operate dangerous machinery, swim alone, or climb in high or dangerous places, such as on ladders, roofs, or girders.  Do not drive unless your doctor says you may.  3. If you have any warning that you may have a seizure, lay down in a safe place where you can't hurt yourself.    4.  No driving for 6 months from last seizure, as per [facility].   Please refer to the following link on the Epilepsy Foundation of America's website for more information: http://www.epilepsyfoundation.org/answerplace/Social/driving/drivingu.cfm   5.  Maintain good sleep hygiene. Avoid alcohol  6.  Contact your doctor if you have any problems that may be related to the medicine you are taking.  7.  Call 911 and bring the patient back to the ED if:        A.  The seizure lasts longer than 5 minutes.       B.  The patient doesn't awaken shortly after the seizure  C.  The patient has new problems such as difficulty seeing, speaking or moving  D.  The patient was injured during the seizure  E.  The patient has a temperature over 102 F (39C)  F.  The patient vomited and now is having trouble breathing

## 2023-06-12 ENCOUNTER — Encounter: Payer: Self-pay | Admitting: Physical Therapy

## 2023-06-12 ENCOUNTER — Other Ambulatory Visit: Payer: Self-pay

## 2023-06-12 ENCOUNTER — Ambulatory Visit: Payer: Medicaid Other | Attending: Neurology | Admitting: Physical Therapy

## 2023-06-12 DIAGNOSIS — R29898 Other symptoms and signs involving the musculoskeletal system: Secondary | ICD-10-CM | POA: Diagnosis not present

## 2023-06-12 DIAGNOSIS — M25511 Pain in right shoulder: Secondary | ICD-10-CM | POA: Diagnosis not present

## 2023-06-12 DIAGNOSIS — M6281 Muscle weakness (generalized): Secondary | ICD-10-CM | POA: Diagnosis present

## 2023-06-12 DIAGNOSIS — M25512 Pain in left shoulder: Secondary | ICD-10-CM | POA: Diagnosis present

## 2023-06-12 DIAGNOSIS — R293 Abnormal posture: Secondary | ICD-10-CM | POA: Insufficient documentation

## 2023-06-12 DIAGNOSIS — G8929 Other chronic pain: Secondary | ICD-10-CM | POA: Diagnosis present

## 2023-06-12 DIAGNOSIS — M25612 Stiffness of left shoulder, not elsewhere classified: Secondary | ICD-10-CM | POA: Diagnosis present

## 2023-06-12 DIAGNOSIS — M542 Cervicalgia: Secondary | ICD-10-CM | POA: Diagnosis present

## 2023-06-12 NOTE — Therapy (Signed)
OUTPATIENT PHYSICAL THERAPY SHOULDER EVALUATION   Patient Name: Charles Beck MRN: [id number] DOB:[birth year], 32 y.o., male Today's Date: 06/12/2023  END OF SESSION:  PT End of Session - 06/12/23 0755     Visit Number 1    Number of Visits 6    Date for PT Re-Evaluation 07/24/23    Authorization Type Medicaid [employer]    PT Start Time 0800    PT Stop Time 0840    PT Time Calculation (min) 40 min    Activity Tolerance Patient tolerated treatment well    Behavior During Therapy [facility] for tasks assessed/performed             Past Medical History:  Diagnosis Date   Anxiety attack    Cellulitis    Lymphedema    Phlebitis    Seizures (HCC)    Past Surgical History:  Procedure Laterality Date   CATARACT EXTRACTION Left    TONSILLECTOMY AND ADENOIDECTOMY     Patient Active Problem List   Diagnosis Date Noted   Maxillary sinusitis 06/24/2022   Dyspnea 06/24/2022   Cellulitis 12/13/2021   Pain, dental 12/13/2021   Seizure disorder (HCC) 05/14/2021   Chronic acquired lymphedema 05/14/2021   Chronic venous insufficiency 05/14/2021   Vitamin D deficiency 08/10/2020    PCP: Everrett Coombe, DO  REFERRING PROVIDER: Van Clines, MD  REFERRING DIAG: M25.511 (ICD-10-CM) - Right shoulder pain, unspecified chronicity R29.898 (ICD-10-CM) - Right arm weakness  THERAPY DIAG:  Abnormal posture  Stiffness of left shoulder, not elsewhere classified  Chronic left shoulder pain  Cervicalgia  Muscle weakness (generalized)  Rationale for Evaluation and Treatment: Rehabilitation  ONSET DATE: 3 years ago  SUBJECTIVE:                                                                                                                                                                                      SUBJECTIVE STATEMENT: Pt states he is not sure what he is referred to PT for. Pt reports he has trembling in his right arm. Pt notes difficulty exercises (I.e. push ups) without  causing increased neck pain. Pt notes increased tightness in his upper shoulders. Pt states he was born with torticollis. Pt feels he has not regained strength in his arm after seizures. 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 Gets bad migraines with tightness in posterior neck. Pt notes heat increases fatigue and swelling. 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 Yes: NPRS  scale: at rest 1, at worst 3-4 /10 Pain location: upper shoulders bilaterally, posterior neck Pain description: "twitch", "weakness" Aggravating factors: working at desk, push ups, holding/lifting for prolonged periods  Relieving factors: Massage (worried about causing inflammation with swelling), ice  PRECAUTIONS: Seizure  RED FLAGS: None   WEIGHT BEARING RESTRICTIONS: No  FALLS:  Has patient fallen in last 6 months? 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  DIAGNOSTIC FINDINGS:  N/a  PATIENT SURVEYS:  QuickDASH Score: 45.5 / 100 = 45.5 %  COGNITION: Overall cognitive status: Within functional limits for tasks assessed     SENSATION: WFL  POSTURE: Rounded shoulders, flattened thoracic spine  UPPER EXTREMITY ROM:   ROM Right eval Left eval  Shoulder flexion A: 160 A: 160  Shoulder extension A: 53 A: 55  Shoulder abduction A: 130* P: 150 A: 130* P: 150  Shoulder adduction    Shoulder internal rotation A: T8* A: T8  Shoulder external rotation A: T3 A: T3  Middle trapezius    Lower trapezius     Elbow flexion    Elbow extension    Wrist flexion    Wrist extension    Wrist ulnar deviation    Wrist radial deviation    Wrist pronation    Wrist supination    Grip strength (lbs)  76  (Blank rows = not tested, * = concordant pain)  UPPER EXTREMITY MMT:  MMT Right eval Left eval  Shoulder flexion 5 5  Shoulder extension 3+ 3+  Shoulder abduction 5* 5  Shoulder adduction    Shoulder internal rotation 5 5  Shoulder external rotation 5 5  Middle trapezius 3+ 3+  Lower trapezius 3 3+  Elbow flexion 5 5  Elbow extension 4 5  Wrist flexion    Wrist extension    Wrist ulnar deviation    Wrist radial deviation    Wrist pronation    Wrist supination    Grip strength (lbs) 71.4, 64.1, 60.1 67.9, 61.5, 57.3  (Blank rows = not tested, * = concordant pain)  SHOULDER SPECIAL TESTS: Impingement tests: Painful arc test: negative SLAP lesions: Biceps load test: negative Instability tests: Posterior drawer test: negative Rotator cuff assessment: Full can test: negative, External rotation lag sign: negative, and Internal rotation lag sign: negative Biceps assessment: Speed's test: negative  JOINT MOBILITY TESTING:  WNL  PALPATION:  UT tightness noted bilaterally and cervical paraspinal tightness   TODAY'S TREATMENT:                                                                                                                                         DATE:  06/12/23 See HEP below   PATIENT EDUCATION: Education details: Exam findings, POC, initial HEP Person educated: Patient Education method: Explanation, Demonstration, and Handouts Education comprehension: verbalized understanding, returned demonstration, and needs further education  HOME EXERCISE PROGRAM: Access Code: [email or web address] URL: [email or web address] Date: 06/12/2023 Prepared by: Vernon Prey April Kirstie Peri  Exercises - Shoulder External Rotation in 45 Degrees Abduction  - 1 x daily - 7 x weekly - 2  sets - 10 reps - Standing Bilateral Low Shoulder Row with Anchored Resistance  - 1 x daily - 7 x weekly - 2 sets - 10 reps - Shoulder extension with resistance - Neutral  - 1 x daily - 7 x weekly - 2 sets - 10 reps - Seated Cervical Retraction  - 1 x daily - 7 x weekly - 2 sets - 10 reps  ASSESSMENT:  CLINICAL IMPRESSION: Patient is a 32 y.o. M who was seen today for physical therapy evaluation and treatment for R shoulder pain and tightness. PMH significant for torticollis, seizures, and Noonan syndrome. On assessment pt demos postural abnormalities, very weak midback and posterior shoulder girdle with increased upper trap compensation/tightness affecting lifting/carrying tasks. Pt will benefit from PT to address these deficits for improved ability to perform ADLs and IADLs at home and at work.   OBJECTIVE IMPAIRMENTS: decreased activity tolerance, decreased endurance, decreased mobility, decreased ROM, decreased strength, increased fascial restrictions, increased muscle spasms, impaired UE functional use, improper body mechanics, postural dysfunction, and pain.   ACTIVITY LIMITATIONS: carrying, lifting, toileting, dressing, reach over head, hygiene/grooming, locomotion level, and caring for others  PARTICIPATION LIMITATIONS: cleaning, driving, shopping, community activity, and occupation  PERSONAL FACTORS: Age, Fitness, Past/current experiences, Profession, and Time since onset of injury/illness/exacerbation are also affecting patient's functional outcome.   REHAB POTENTIAL: Good  CLINICAL DECISION MAKING: Evolving/moderate complexity  EVALUATION COMPLEXITY: Moderate   GOALS: Goals reviewed with patient? Yes  SHORT TERM GOALS: Target date: 07/03/2023   Pt will be ind with initial HEP Baseline: Goal status: INITIAL  2.  Pt will demo full shoulder abd AROM bilaterally Baseline:  Goal status: INITIAL  3.  Pt will report no shoulder pain/discomfort at rest Baseline:  Goal  status: INITIAL   LONG TERM GOALS: Target date: 07/24/2023   Pt will be ind with management and progression of HEP Baseline:  Goal status: INITIAL  2.  Pt will demo at least 4/5 shoulder and midback strength for improved postural stability Baseline:  Goal status: INITIAL  3.  Pt will be able to tolerate standing at his standing desk at work for >20 min without increased pain/tension in c-spine and shoulders Baseline:  Goal status: INITIAL  4.  Pt will have improved QuickDASH to </=35% to demo MCID Baseline:  Goal status: INITIAL  5.  Pt will be able to lift and carry at least 45# to carry his child at home with pain </=2/10 Baseline:  Goal status: INITIAL   PLAN:  PT FREQUENCY: 1x/week  PT DURATION: 6 weeks  PLANNED INTERVENTIONS: 97164- PT Re-evaluation, 97110-Therapeutic exercises, 97530- Therapeutic activity, 97112- Neuromuscular re-education, 97535- Self Care, 44034- Manual therapy, 97014- Electrical stimulation (unattended), Patient/Family education, Taping, Dry Needling, Joint mobilization, Spinal mobilization, Cryotherapy, and Moist heat  PLAN FOR NEXT SESSION: Assess response to HEP. Continue to work on Quarry manager. Watch for upper trap compensation. Consider manual work as indicated (use caution as pt states this can sometimes cause increased inflammation for him as well as using heat). Initiate core strengthening/trunk stability as well.  Gitel Beste April Ma L Monick Rena, PT 06/12/2023, 1:22 PM

## 2023-06-19 ENCOUNTER — Ambulatory Visit: Payer: Medicaid Other | Admitting: Physical Therapy

## 2023-06-19 DIAGNOSIS — G8929 Other chronic pain: Secondary | ICD-10-CM

## 2023-06-19 DIAGNOSIS — R293 Abnormal posture: Secondary | ICD-10-CM | POA: Diagnosis not present

## 2023-06-19 DIAGNOSIS — M542 Cervicalgia: Secondary | ICD-10-CM

## 2023-06-19 DIAGNOSIS — M6281 Muscle weakness (generalized): Secondary | ICD-10-CM

## 2023-06-19 NOTE — Therapy (Addendum)
OUTPATIENT PHYSICAL THERAPY SHOULDER TREATMENT    Patient Name: Charles Beck MRN: [id number] DOB:06-19-1991, 32 y.o., male Today's Date: 06/19/2023  END OF SESSION:  PT End of Session - 06/19/23 0807     Visit Number 2    Number of Visits 6    Date for PT Re-Evaluation 07/24/23    Authorization Type [employer]    PT Start Time 0806   Pt arrived late   PT Stop Time 0844    PT Time Calculation (min) 38 min    Activity Tolerance Patient tolerated treatment well    Behavior During Therapy [facility] for tasks assessed/performed             Past Medical History:  Diagnosis Date   Anxiety attack    Cellulitis    Lymphedema    Phlebitis    Seizures (HCC)    Past Surgical History:  Procedure Laterality Date   CATARACT EXTRACTION Left    TONSILLECTOMY AND ADENOIDECTOMY     Patient Active Problem List   Diagnosis Date Noted   Maxillary sinusitis 06/24/2022   Dyspnea 06/24/2022   Cellulitis 12/13/2021   Pain, dental 12/13/2021   Seizure disorder (HCC) 05/14/2021   Chronic acquired lymphedema 05/14/2021   Chronic venous insufficiency 05/14/2021   Vitamin D deficiency 08/10/2020    PCP: Everrett Coombe, DO  REFERRING PROVIDER: Van Clines, MD  REFERRING DIAG: M25.511 (ICD-10-CM) - Right shoulder pain, unspecified chronicity R29.898 (ICD-10-CM) - Right arm weakness  THERAPY DIAG:  Chronic left shoulder pain  Abnormal posture  Muscle weakness (generalized)  Cervicalgia  Rationale for Evaluation and Treatment: Rehabilitation  ONSET DATE: 3 years ago  SUBJECTIVE:                                                                                                                                                                                      SUBJECTIVE STATEMENT: Pt reports is being gentle with his exercises but does feel like they are helping his posture and strength. Still getting some pain in his R neck, which he describes as a muscle spasm.   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 Yes: NPRS scale: at rest 1, at worst 3-4 /10 Pain location: upper shoulders bilaterally, posterior neck Pain description: "twitch", "weakness" Aggravating factors: working at desk, push ups, holding/lifting for prolonged periods  Relieving factors: Massage (worried about causing inflammation with swelling), ice  PRECAUTIONS: Seizure  RED FLAGS: None   WEIGHT BEARING RESTRICTIONS: No  FALLS:  Has patient fallen in last 6  months? 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  DIAGNOSTIC FINDINGS:  N/a  PATIENT SURVEYS:  QuickDASH Score: 45.5 / 100 = 45.5 %  COGNITION: Overall cognitive status: Within functional limits for tasks assessed     SENSATION: WFL  POSTURE: Rounded shoulders, flattened thoracic spine  UPPER EXTREMITY ROM:   ROM Right eval Left eval  Shoulder flexion A: 160 A: 160  Shoulder extension A: 53 A: 55  Shoulder abduction A: 130* P: 150 A: 130* P: 150  Shoulder adduction    Shoulder internal rotation A: T8* A: T8  Shoulder external rotation A: T3 A: T3  Middle trapezius    Lower trapezius    Elbow flexion    Elbow extension    Wrist flexion    Wrist extension    Wrist ulnar deviation    Wrist radial deviation    Wrist pronation    Wrist supination    Grip strength (lbs)  76  (Blank rows = not tested, * = concordant pain)  UPPER EXTREMITY MMT:  MMT Right eval Left eval  Shoulder flexion 5 5  Shoulder extension 3+ 3+  Shoulder abduction 5* 5  Shoulder adduction     Shoulder internal rotation 5 5  Shoulder external rotation 5 5  Middle trapezius 3+ 3+  Lower trapezius 3 3+  Elbow flexion 5 5  Elbow extension 4 5  Wrist flexion    Wrist extension    Wrist ulnar deviation    Wrist radial deviation    Wrist pronation    Wrist supination    Grip strength (lbs) 71.4, 64.1, 60.1 67.9, 61.5, 57.3  (Blank rows = not tested, * = concordant pain)  SHOULDER SPECIAL TESTS: Impingement tests: Painful arc test: negative SLAP lesions: Biceps load test: negative Instability tests: Posterior drawer test: negative Rotator cuff assessment: Full can test: negative, External rotation lag sign: negative, and Internal rotation lag sign: negative Biceps assessment: Speed's test: negative  JOINT MOBILITY TESTING:  WNL  PALPATION:  UT tightness noted bilaterally and cervical paraspinal tightness   TODAY'S TREATMENT:        Ther Ex   Chirp wheel on neck, x5 minutes, for sub-occipital release and improved cervical ROM. Pt reported feeling relief of sub-occipitals w/wheel.  Prone supermans for improved posterior chain strength and scapular stability, x15 reps w/2-3s isometric hold. Pt very challenged by this and feeling like there is "scar tissue" blocking his ability to perform. Pt able to lift arms from mat but not his chest. Added to HEP (see bolded below) I, Y, T's w/2# dumbbells for improved shoulder stability and scapular stability, x12 reps each direction.  PVC passthroughs x20 reps for improved functional shoulder mobility. Pt challenged w/shoulder flexion but was able to perform shoulder extension well.   Bent over single arm rows w/12# KB, x10 reps per side, for improved trap and rhomboid strength. Increased difficulty performing on LUE > RUE  PATIENT EDUCATION: Education details: Additions to HEP  Person educated: Patient Education method: Explanation, Demonstration, and Handouts Education comprehension: verbalized understanding, returned  demonstration, and needs further education  HOME EXERCISE PROGRAM: Access Code: [email or web address] URL: [email or web address] Date: 06/12/2023 Prepared by: Vernon Prey April Kirstie Peri  Exercises - Shoulder External Rotation in 45 Degrees  Abduction  - 1 x daily - 7 x weekly - 2 sets - 10 reps - Standing Bilateral Low Shoulder Row with Anchored Resistance  - 1 x daily - 7 x weekly - 2 sets - 10 reps - Shoulder extension with resistance - Neutral  - 1 x daily - 7 x weekly - 2 sets - 10 reps - Seated Cervical Retraction  - 1 x daily - 7 x weekly - 2 sets - 10 reps - Modified Superman on Table  - 1 x daily - 7 x weekly - 3 sets - 10 reps - 2-3 second hold  ASSESSMENT:  CLINICAL IMPRESSION: Emphasis of skilled PT session on improved cervical and shoulder mobility as well as functional strength of periscapular musculature. Pt continues to report feeling a "block" in his upper back that prevents him from performing exercises properly and causes pain. Pt most challenged by single arm rows on LUE due to functional middle/lower trap and rhomboid weakness but reported feeling very accomplished and more relaxed once completing exercise. Continue POC.   OBJECTIVE IMPAIRMENTS: decreased activity tolerance, decreased endurance, decreased mobility, decreased ROM, decreased strength, increased fascial restrictions, increased muscle spasms, impaired UE functional use, improper body mechanics, postural dysfunction, and pain.   ACTIVITY LIMITATIONS: carrying, lifting, toileting, dressing, reach over head, hygiene/grooming, locomotion level, and caring for others  PARTICIPATION LIMITATIONS: cleaning, driving, shopping, community activity, and occupation  PERSONAL FACTORS: Age, Fitness, Past/current experiences, Profession, and Time since onset of injury/illness/exacerbation are also affecting patient's functional outcome.   REHAB POTENTIAL: Good  CLINICAL DECISION MAKING: Evolving/moderate  complexity  EVALUATION COMPLEXITY: Moderate   GOALS: Goals reviewed with patient? Yes  SHORT TERM GOALS: Target date: 07/03/2023   Pt will be ind with initial HEP Baseline: Goal status: INITIAL  2.  Pt will demo full shoulder abd AROM bilaterally Baseline:  Goal status: INITIAL  3.  Pt will report no shoulder pain/discomfort at rest Baseline:  Goal status: INITIAL   LONG TERM GOALS: Target date: 07/24/2023   Pt will be ind with management and progression of HEP Baseline:  Goal status: INITIAL  2.  Pt will demo at least 4/5 shoulder and midback strength for improved postural stability Baseline:  Goal status: INITIAL  3.  Pt will be able to tolerate standing at his standing desk at work for >20 min without increased pain/tension in c-spine and shoulders Baseline:  Goal status: INITIAL  4.  Pt will have improved QuickDASH to </=35% to demo MCID Baseline:  Goal status: INITIAL  5.  Pt will be able to lift and carry at least 45# to carry his child at home with pain </=2/10 Baseline:  Goal status: INITIAL   PLAN:  PT FREQUENCY: 1x/week  PT DURATION: 6 weeks  PLANNED INTERVENTIONS: 97164- PT Re-evaluation, 97110-Therapeutic exercises, 97530- Therapeutic activity, 97112- Neuromuscular re-education, 97535- Self Care, 01027- Manual therapy, 97014- Electrical stimulation (unattended), Patient/Family education, Taping, Dry Needling, Joint mobilization, Spinal mobilization, Cryotherapy, and Moist heat  PLAN FOR NEXT SESSION: Check appointments- looks like he was scheduled for 8? Assess response to HEP. Continue to work on Quarry manager. Watch for upper trap compensation. Consider manual work as indicated (use caution as pt states this can sometimes cause increased inflammation for him as well as using heat). Initiate core strengthening/trunk stability as well.    Jill Alexanders Kelcy Laible, PT, DPT 06/19/2023, 8:44 AM

## 2023-06-26 ENCOUNTER — Ambulatory Visit: Payer: Medicaid Other | Admitting: Physical Therapy

## 2023-06-26 DIAGNOSIS — R293 Abnormal posture: Secondary | ICD-10-CM | POA: Diagnosis not present

## 2023-06-26 DIAGNOSIS — G8929 Other chronic pain: Secondary | ICD-10-CM

## 2023-06-26 DIAGNOSIS — M542 Cervicalgia: Secondary | ICD-10-CM

## 2023-06-26 DIAGNOSIS — M25612 Stiffness of left shoulder, not elsewhere classified: Secondary | ICD-10-CM

## 2023-06-26 DIAGNOSIS — M6281 Muscle weakness (generalized): Secondary | ICD-10-CM

## 2023-06-26 NOTE — Therapy (Signed)
OUTPATIENT PHYSICAL THERAPY SHOULDER TREATMENT    Patient Name: Charles Beck MRN: [id number] DOB:[birth year], 32 y.o., male Today's Date: 06/26/2023  END OF SESSION:  PT End of Session - 06/26/23 0757     Visit Number 3    Number of Visits 6    Date for PT Re-Evaluation 07/24/23    Authorization Type [employer]    PT Start Time 0800    PT Stop Time 0840    PT Time Calculation (min) 40 min    Activity Tolerance Patient tolerated treatment well    Behavior During Therapy [facility] for tasks assessed/performed             Past Medical History:  Diagnosis Date   Anxiety attack    Cellulitis    Lymphedema    Phlebitis    Seizures (HCC)    Past Surgical History:  Procedure Laterality Date   CATARACT EXTRACTION Left    TONSILLECTOMY AND ADENOIDECTOMY     Patient Active Problem List   Diagnosis Date Noted   Maxillary sinusitis 06/24/2022   Dyspnea 06/24/2022   Cellulitis 12/13/2021   Pain, dental 12/13/2021   Seizure disorder (HCC) 05/14/2021   Chronic acquired lymphedema 05/14/2021   Chronic venous insufficiency 05/14/2021   Vitamin D deficiency 08/10/2020    PCP: Everrett Coombe, DO  REFERRING PROVIDER: Van Clines, MD  REFERRING DIAG: M25.511 (ICD-10-CM) - Right shoulder pain, unspecified chronicity R29.898 (ICD-10-CM) - Right arm weakness  THERAPY DIAG:  Chronic left shoulder pain  Abnormal posture  Muscle weakness (generalized)  Cervicalgia  Stiffness of left shoulder, not elsewhere classified  Rationale for Evaluation and Treatment: Rehabilitation  ONSET DATE: 3 years ago  SUBJECTIVE:                                                                                                                                                                                      SUBJECTIVE STATEMENT: Pt reports he slept in a funny position and woke up today feeling very stiff. States shoulders are feeling okay. Pt states exercises are going well at  home. Has been noticing less tension/swelling in UTs.   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 Yes: NPRS scale: at rest 1, at worst 3-4 /10 Pain location: upper shoulders bilaterally, posterior neck Pain description: "twitch", "weakness" Aggravating factors: working at desk, push ups, holding/lifting for prolonged periods  Relieving factors: Massage (worried about causing inflammation with swelling), ice  PRECAUTIONS: Seizure  RED FLAGS: None   WEIGHT BEARING RESTRICTIONS: No  FALLS:  Has  patient fallen in last 6 months? 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  DIAGNOSTIC FINDINGS:  N/a  PATIENT SURVEYS:  QuickDASH Score: 45.5 / 100 = 45.5 %  COGNITION: Overall cognitive status: Within functional limits for tasks assessed     SENSATION: WFL  POSTURE: Rounded shoulders, flattened thoracic spine  UPPER EXTREMITY ROM:   ROM Right eval Left eval  Shoulder flexion A: 160 A: 160  Shoulder extension A: 53 A: 55  Shoulder abduction A: 130* P: 150 A: 130* P: 150  Shoulder adduction    Shoulder internal rotation A: T8* A: T8  Shoulder external rotation A: T3 A: T3  Middle trapezius    Lower trapezius    Elbow flexion    Elbow extension    Wrist flexion    Wrist extension    Wrist ulnar deviation    Wrist radial deviation    Wrist pronation    Wrist supination    Grip strength (lbs)  76  (Blank rows = not tested, * = concordant pain)  UPPER EXTREMITY MMT:  MMT Right eval Left eval  Shoulder flexion 5 5  Shoulder  extension 3+ 3+  Shoulder abduction 5* 5  Shoulder adduction    Shoulder internal rotation 5 5  Shoulder external rotation 5 5  Middle trapezius 3+ 3+  Lower trapezius 3 3+  Elbow flexion 5 5  Elbow extension 4 5  Wrist flexion    Wrist extension    Wrist ulnar deviation    Wrist radial deviation    Wrist pronation    Wrist supination    Grip strength (lbs) 71.4, 64.1, 60.1 67.9, 61.5, 57.3  (Blank rows = not tested, * = concordant pain)  SHOULDER SPECIAL TESTS: Impingement tests: Painful arc test: negative SLAP lesions: Biceps load test: negative Instability tests: Posterior drawer test: negative Rotator cuff assessment: Full can test: negative, External rotation lag sign: negative, and Internal rotation lag sign: negative Biceps assessment: Speed's test: negative  JOINT MOBILITY TESTING:  WNL  PALPATION:  UT tightness noted bilaterally and cervical paraspinal tightness   TODAY'S TREATMENT:        Therex UBE fwd x2 min, bwd x2 min Cat/cow into child's pose x10 Quadruped thoracic rotation x10 Child's pose with lateral flexion x30 sec each Quadruped bird dog x10 Cervical retraction x10 Shoulder ER red TB 2x10 "W" red TB 2x10 Row red TB 2x10 Lat stretch x 30" Shoulder ext red TB 2x10   PATIENT EDUCATION: Education details: Additions to HEP  Person educated: Patient Education method: Explanation, Demonstration, and Handouts Education comprehension: verbalized understanding, returned demonstration, and needs further education  HOME EXERCISE PROGRAM: Access Code: [email or web address] URL: [email or web address] Date: 06/26/2023 Prepared by: Vernon Prey April Kirstie Peri  Exercises - Shoulder External Rotation in 45 Degrees Abduction  - 1 x daily - 7 x weekly - 2 sets - 10 reps - Standing Bilateral Low Shoulder Row with Anchored Resistance  - 1 x daily - 7 x weekly - 2 sets - 10 reps - Shoulder extension with resistance - Neutral  - 1 x daily - 7 x weekly - 2 sets  - 10 reps - Seated Cervical Retraction  - 1 x daily - 7 x weekly - 2 sets -  10 reps - Modified Superman on Table  - 1 x daily - 7 x weekly - 3 sets - 10 reps - 2-3 second hold - Cat Cow to Child's Pose  - 1 x daily - 7 x weekly - 1 sets - 10 reps - Bird Dog  - 1 x daily - 7 x weekly - 2 sets - 10 reps  ASSESSMENT:  CLINICAL IMPRESSION: Pt reported feeling stiffer today in shoulders/trunk/midback. Worked on gross spinal and shoulder mobility and strengthening in quadruped today to address this. Progressed pt to red TB for HEP. Continued to work on postural and posterior shoulder girdle strengthening with good pt tolerance. Provided pt bird dogs as an alternative to supermans if it's too challenging at home.   OBJECTIVE IMPAIRMENTS: decreased activity tolerance, decreased endurance, decreased mobility, decreased ROM, decreased strength, increased fascial restrictions, increased muscle spasms, impaired UE functional use, improper body mechanics, postural dysfunction, and pain.   ACTIVITY LIMITATIONS: carrying, lifting, toileting, dressing, reach over head, hygiene/grooming, locomotion level, and caring for others  PARTICIPATION LIMITATIONS: cleaning, driving, shopping, community activity, and occupation  PERSONAL FACTORS: Age, Fitness, Past/current experiences, Profession, and Time since onset of injury/illness/exacerbation are also affecting patient's functional outcome.   REHAB POTENTIAL: Good  CLINICAL DECISION MAKING: Evolving/moderate complexity  EVALUATION COMPLEXITY: Moderate   GOALS: Goals reviewed with patient? Yes  SHORT TERM GOALS: Target date: 07/03/2023   Pt will be ind with initial HEP Baseline: Goal status: INITIAL  2.  Pt will demo full shoulder abd AROM bilaterally Baseline:  Goal status: INITIAL  3.  Pt will report no shoulder pain/discomfort at rest Baseline:  Goal status: INITIAL   LONG TERM GOALS: Target date: 07/24/2023   Pt will be ind with  management and progression of HEP Baseline:  Goal status: INITIAL  2.  Pt will demo at least 4/5 shoulder and midback strength for improved postural stability Baseline:  Goal status: INITIAL  3.  Pt will be able to tolerate standing at his standing desk at work for >20 min without increased pain/tension in c-spine and shoulders Baseline:  Goal status: INITIAL  4.  Pt will have improved QuickDASH to </=35% to demo MCID Baseline:  Goal status: INITIAL  5.  Pt will be able to lift and carry at least 45# to carry his child at home with pain </=2/10 Baseline:  Goal status: INITIAL   PLAN:  PT FREQUENCY: 1x/week  PT DURATION: 6 weeks  PLANNED INTERVENTIONS: 97164- PT Re-evaluation, 97110-Therapeutic exercises, 97530- Therapeutic activity, 97112- Neuromuscular re-education, 97535- Self Care, 16109- Manual therapy, 97014- Electrical stimulation (unattended), Patient/Family education, Taping, Dry Needling, Joint mobilization, Spinal mobilization, Cryotherapy, and Moist heat  PLAN FOR NEXT SESSION: Recheck STGs. Assess response to HEP. Continue to work on Quarry manager. Watch for upper trap compensation. Consider manual work as indicated (use caution as pt states this can sometimes cause increased inflammation for him as well as using heat). Continue core/trunk strengthening/stabilization.   Adisen Bennion April Ma L Jayceion Lisenby, PT, DPT 06/26/2023, 7:57 AM

## 2023-07-03 ENCOUNTER — Ambulatory Visit: Payer: Medicaid Other | Admitting: Physical Therapy

## 2023-07-03 DIAGNOSIS — R293 Abnormal posture: Secondary | ICD-10-CM

## 2023-07-03 DIAGNOSIS — G8929 Other chronic pain: Secondary | ICD-10-CM

## 2023-07-03 DIAGNOSIS — M6281 Muscle weakness (generalized): Secondary | ICD-10-CM

## 2023-07-03 DIAGNOSIS — M542 Cervicalgia: Secondary | ICD-10-CM

## 2023-07-03 DIAGNOSIS — M25612 Stiffness of left shoulder, not elsewhere classified: Secondary | ICD-10-CM

## 2023-07-03 NOTE — Therapy (Signed)
OUTPATIENT PHYSICAL THERAPY SHOULDER TREATMENT    Patient Name: Charles Beck MRN: [id number] DOB:November 11, 1990, 32 y.o., male Today's Date: 07/03/2023  END OF SESSION:  PT End of Session - 07/03/23 0754     Visit Number 4    Number of Visits 6    Date for PT Re-Evaluation 07/24/23    Authorization Type [employer]    PT Start Time 0800    PT Stop Time 0840    PT Time Calculation (min) 40 min    Activity Tolerance Patient tolerated treatment well    Behavior During Therapy [facility] for tasks assessed/performed             Past Medical History:  Diagnosis Date   Anxiety attack    Cellulitis    Lymphedema    Phlebitis    Seizures (HCC)    Past Surgical History:  Procedure Laterality Date   CATARACT EXTRACTION Left    TONSILLECTOMY AND ADENOIDECTOMY     Patient Active Problem List   Diagnosis Date Noted   Maxillary sinusitis 06/24/2022   Dyspnea 06/24/2022   Cellulitis 12/13/2021   Pain, dental 12/13/2021   Seizure disorder (HCC) 05/14/2021   Chronic acquired lymphedema 05/14/2021   Chronic venous insufficiency 05/14/2021   Vitamin D deficiency 08/10/2020    PCP: Everrett Coombe, DO  REFERRING PROVIDER: Van Clines, MD  REFERRING DIAG: M25.511 (ICD-10-CM) - Right shoulder pain, unspecified chronicity R29.898 (ICD-10-CM) - Right arm weakness  THERAPY DIAG:  Chronic left shoulder pain  Abnormal posture  Muscle weakness (generalized)  Cervicalgia  Stiffness of left shoulder, not elsewhere classified  Rationale for Evaluation and Treatment: Rehabilitation  ONSET DATE: 3 years ago  SUBJECTIVE:                                                                                                                                                                                      SUBJECTIVE STATEMENT: Pt states he has not been able to sleep well. Reports he tried the red band but feels it may have tightened up his UTs more. Notes a slight headache/ear  ache.    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 Yes: NPRS scale: 1-2 /10 Pain location: upper shoulders bilaterally, posterior neck Pain description: "twitch", "weakness" Aggravating factors: working at desk, push ups, holding/lifting for prolonged periods  Relieving factors: Massage (worried about causing inflammation with swelling), ice  PRECAUTIONS: Seizure  RED FLAGS: None   WEIGHT BEARING RESTRICTIONS: No  FALLS:  Has patient fallen in last 6 months? 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  PATIENT SURVEYS:  QuickDASH Score: 45.5 / 100 = 45.5 %  POSTURE: Rounded shoulders, flattened thoracic spine  UPPER EXTREMITY ROM:   ROM Right eval Left eval Right/Left  Shoulder flexion A: 160 A: 160   Shoulder extension A: 53 A: 55   Shoulder abduction A: 130* P: 150 A: 130* P: 150 A: 145/145   Shoulder adduction     Shoulder internal rotation A: T8* A: T8 A: T8/T8  Shoulder external rotation A: T3 A: T3   Middle trapezius     Lower trapezius     Elbow flexion     Elbow extension     Wrist flexion     Wrist extension     Wrist ulnar deviation     Wrist radial deviation     Wrist pronation     Wrist supination     Grip strength (lbs)  76   (Blank rows = not tested, * = concordant pain)  UPPER EXTREMITY MMT:  MMT Right eval Left eval  Shoulder flexion 5 5  Shoulder extension 3+ 3+  Shoulder abduction 5* 5  Shoulder adduction    Shoulder internal rotation 5 5  Shoulder external rotation 5 5  Middle trapezius 3+ 3+   Lower trapezius 3 3+  Elbow flexion 5 5  Elbow extension 4 5  Wrist flexion    Wrist extension    Wrist ulnar deviation    Wrist radial deviation    Wrist pronation    Wrist supination    Grip strength (lbs) 71.4, 64.1, 60.1 67.9, 61.5, 57.3  (Blank rows = not tested, * = concordant pain)  SHOULDER SPECIAL TESTS: Impingement tests: Painful arc test: negative SLAP lesions: Biceps load test: negative Instability tests: Posterior drawer test: negative Rotator cuff assessment: Full can test: negative, External rotation lag sign: negative, and Internal rotation lag sign: negative Biceps assessment: Speed's test: negative  JOINT MOBILITY TESTING:  WNL  PALPATION:  UT tightness noted bilaterally and cervical paraspinal tightness   TODAY'S TREATMENT:        Therex UBE fwd x2 min, bwd x2 min Levator scap stretch x 30 sec Upper trap stretch x 30 sec Suboccipital stretch x30 sec Quadruped bird dog x10 Quadruped thoracic rotation x10 Standing shoulder ER red TB 2x8 Standing "W" yellow TB 2x8 Standing protraction/retraction 2x10 Wall push up plus with scap retraction 2x10 Lat stretch x 30" Low trap setting x10 with v/cs to decrease UT compensation Self Care Using chirp wheel and/or golf balls for self massage   PATIENT EDUCATION: Education details: Additions to HEP  Person educated: Patient Education method: Explanation, Demonstration, and Handouts Education comprehension: verbalized understanding, returned demonstration, and needs further education  HOME EXERCISE PROGRAM: Access Code: [email or web address] URL: [email or web address] Date: 06/26/2023 Prepared by: Vernon Prey April Kirstie Peri  Exercises - Shoulder External Rotation in 45 Degrees Abduction  - 1 x daily - 7 x weekly - 2 sets - 10 reps - Standing Bilateral Low Shoulder Row with Anchored Resistance  - 1 x daily - 7 x weekly - 2 sets - 10 reps - Shoulder extension with resistance - Neutral  - 1 x daily - 7 x  weekly - 2 sets - 10 reps -  Seated Cervical Retraction  - 1 x daily - 7 x weekly - 2 sets - 10 reps - Modified Superman on Table  - 1 x daily - 7 x weekly - 3 sets - 10 reps - 2-3 second hold - Cat Cow to Child's Pose  - 1 x daily - 7 x weekly - 1 sets - 10 reps - Bird Dog  - 1 x daily - 7 x weekly - 2 sets - 10 reps  ASSESSMENT:  CLINICAL IMPRESSION: Worked primarily in standing today to work on full standing posture. Pt with difficulty coordinating correct movements with red TB for resistance. Able to perform better with decrease to yellow TB. Provided stretches to address his neck/shoulder tightness. Pt is progressing towards his goals. His shoulder abduction AROM has increased but still not yet to full range. Pt gets mild 1-2 shoulder discomfort at rest. He has remained consistent with his general HEP.   OBJECTIVE IMPAIRMENTS: decreased activity tolerance, decreased endurance, decreased mobility, decreased ROM, decreased strength, increased fascial restrictions, increased muscle spasms, impaired UE functional use, improper body mechanics, postural dysfunction, and pain.    GOALS: Goals reviewed with patient? Yes  SHORT TERM GOALS: Target date: 07/03/2023   Pt will be ind with initial HEP Baseline: Goal status: MET  2.  Pt will demo full shoulder abd AROM bilaterally Baseline: Grossly ~130 deg bilat Goal status: IN PROGRESS (see above - 07/03/23)  3.  Pt will report no shoulder pain/discomfort at rest Baseline: 1-2 Goal status: IN PROGRESS (remains at a 1-2 on 07/03/23)   LONG TERM GOALS: Target date: 07/24/2023   Pt will be ind with management and progression of HEP Baseline:  Goal status: INITIAL  2.  Pt will demo at least 4/5 shoulder and midback strength for improved postural stability Baseline:  Goal status: INITIAL  3.  Pt will be able to tolerate standing at his standing desk at work for >20 min without increased pain/tension in c-spine and shoulders Baseline:   Goal status: INITIAL  4.  Pt will have improved QuickDASH to </=35% to demo MCID Baseline:  Goal status: INITIAL  5.  Pt will be able to lift and carry at least 45# to carry his child at home with pain </=2/10 Baseline:  Goal status: INITIAL   PLAN:  PT FREQUENCY: 1x/week  PT DURATION: 6 weeks  PLANNED INTERVENTIONS: 97164- PT Re-evaluation, 97110-Therapeutic exercises, 97530- Therapeutic activity, 97112- Neuromuscular re-education, 97535- Self Care, 09811- Manual therapy, 97014- Electrical stimulation (unattended), Patient/Family education, Taping, Dry Needling, Joint mobilization, Spinal mobilization, Cryotherapy, and Moist heat  PLAN FOR NEXT SESSION: Assess response to HEP. Continue to work on Quarry manager. Watch for upper trap compensation. Consider manual work as indicated (use caution as pt states this can sometimes cause increased inflammation for him as well as using heat). Continue core/trunk strengthening/stabilization.   Misheel Gowans April Ma L Lovell Nuttall, PT, DPT 07/03/2023, 7:55 AM

## 2023-07-10 ENCOUNTER — Ambulatory Visit: Payer: Medicaid Other | Attending: Family Medicine | Admitting: Physical Therapy

## 2023-07-10 DIAGNOSIS — M25512 Pain in left shoulder: Secondary | ICD-10-CM | POA: Insufficient documentation

## 2023-07-10 DIAGNOSIS — R293 Abnormal posture: Secondary | ICD-10-CM | POA: Diagnosis present

## 2023-07-10 DIAGNOSIS — G8929 Other chronic pain: Secondary | ICD-10-CM | POA: Insufficient documentation

## 2023-07-10 DIAGNOSIS — R42 Dizziness and giddiness: Secondary | ICD-10-CM | POA: Insufficient documentation

## 2023-07-10 DIAGNOSIS — M6281 Muscle weakness (generalized): Secondary | ICD-10-CM | POA: Diagnosis present

## 2023-07-10 DIAGNOSIS — M542 Cervicalgia: Secondary | ICD-10-CM | POA: Insufficient documentation

## 2023-07-10 NOTE — Therapy (Signed)
OUTPATIENT PHYSICAL THERAPY SHOULDER TREATMENT    Patient Name: Charles Beck MRN: [id number] DOB:05/31/1991, 32 y.o., male Today's Date: 07/10/2023  END OF SESSION:  PT End of Session - 07/10/23 0805     Visit Number 5    Number of Visits 6    Date for PT Re-Evaluation 07/24/23    Authorization Type Medicaid [employer]    PT Start Time 0803    PT Stop Time 0844    PT Time Calculation (min) 41 min    Activity Tolerance Patient tolerated treatment well    Behavior During Therapy [facility] for tasks assessed/performed             Past Medical History:  Diagnosis Date   Anxiety attack    Cellulitis    Lymphedema    Phlebitis    Seizures (HCC)    Past Surgical History:  Procedure Laterality Date   CATARACT EXTRACTION Left    TONSILLECTOMY AND ADENOIDECTOMY     Patient Active Problem List   Diagnosis Date Noted   Maxillary sinusitis 06/24/2022   Dyspnea 06/24/2022   Cellulitis 12/13/2021   Pain, dental 12/13/2021   Seizure disorder (HCC) 05/14/2021   Chronic acquired lymphedema 05/14/2021   Chronic venous insufficiency 05/14/2021   Vitamin D deficiency 08/10/2020    PCP: Everrett Coombe, DO  REFERRING PROVIDER: Van Clines, MD  REFERRING DIAG: M25.511 (ICD-10-CM) - Right shoulder pain, unspecified chronicity R29.898 (ICD-10-CM) - Right arm weakness  THERAPY DIAG:  Chronic left shoulder pain  Abnormal posture  Muscle weakness (generalized)  Cervicalgia  Rationale for Evaluation and Treatment: Rehabilitation  ONSET DATE: 3 years ago  SUBJECTIVE:                                                                                                                                                                                      SUBJECTIVE STATEMENT: Pt reports he felt his trap "release" during the night, so denies pain currently. Feels dehydrated, requesting water. Feels better with the yellow theraband.   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 Yes: NPRS scale: 1-2 /10 Pain location: upper shoulders bilaterally, posterior neck Pain description: "twitch", "weakness" Aggravating factors: working at desk, push ups, holding/lifting for prolonged periods  Relieving factors: Massage (worried about causing inflammation with swelling), ice  PRECAUTIONS: Seizure  RED FLAGS: None   WEIGHT BEARING RESTRICTIONS: No  FALLS:  Has patient fallen in last 6 months? 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  PATIENT SURVEYS:  QuickDASH Score: 45.5 / 100 = 45.5 %  POSTURE: Rounded shoulders, flattened thoracic spine  UPPER EXTREMITY ROM:   ROM Right eval Left eval Right/Left  Shoulder flexion A: 160 A: 160   Shoulder extension A: 53 A: 55   Shoulder abduction A: 130* P: 150 A: 130* P: 150 A: 145/145   Shoulder adduction     Shoulder internal rotation A: T8* A: T8 A: T8/T8  Shoulder external rotation A: T3 A: T3   Middle trapezius     Lower trapezius     Elbow flexion     Elbow extension     Wrist flexion     Wrist extension     Wrist ulnar deviation     Wrist radial deviation     Wrist pronation     Wrist supination     Grip strength (lbs)  76   (Blank rows = not tested, * = concordant pain)  UPPER EXTREMITY MMT:  MMT Right eval Left eval  Shoulder flexion 5 5  Shoulder extension 3+ 3+  Shoulder abduction 5* 5  Shoulder adduction    Shoulder internal rotation 5 5  Shoulder external rotation 5 5  Middle trapezius 3+ 3+  Lower trapezius 3 3+  Elbow flexion 5 5   Elbow extension 4 5  Wrist flexion    Wrist extension    Wrist ulnar deviation    Wrist radial deviation    Wrist pronation    Wrist supination    Grip strength (lbs) 71.4, 64.1, 60.1 67.9, 61.5, 57.3  (Blank rows = not tested, * = concordant pain)  SHOULDER SPECIAL TESTS: Impingement tests: Painful arc test: negative SLAP lesions: Biceps load test: negative Instability tests: Posterior drawer test: negative Rotator cuff assessment: Full can test: negative, External rotation lag sign: negative, and Internal rotation lag sign: negative Biceps assessment: Speed's test: negative  JOINT MOBILITY TESTING:  WNL  PALPATION:  UT tightness noted bilaterally and cervical paraspinal tightness   TODAY'S TREATMENT:        Ther Ex UBE level 1.1 fwd x3 min, retro x3 min Reviewed W's unweighted x10 reps and then w/orange resistance band, x15 reps. Noted slight elevation of R shoulder compared to L but no upper trap compensation noted.  Prone Y, T, A's over gumdrops, x15 reps, for improved periscapular strength. Decreased ROM noted on L shoulder. Pt reported difficulty w/task but was able to perform well. Pt went into child's pose following activity due to tightness in low back. Carrying surge x300' for proper carrying technique, core stability and practice of carrying children at home. Cues to avoid shrug w/R shoulder initially, which pt able to perform well. Pt reported feeling tightness in R shoulder w/activity.  Pronated rows w/surge, x20 reps, for improved upper/middle trap strength and scapular retraction strength. Pt required max multimodal cues for proper form and maintaining close distance to surge rather than allowing surge to be far from body.  OH presses w/surge, x15 reps, for improved core stability and functional UE strength. Mod multimodal cues for proper head positioning and maintaining close distance to surge to avoid strain on shoulders and low back. Noted trembling of RUE  throughout, but pt able to maintain good form.    PATIENT EDUCATION: Education details: Continue HEP  Person educated: Patient Education method: Explanation Education comprehension: verbalized understanding, returned demonstration, and needs further education  HOME EXERCISE PROGRAM: Access Code: [email or web address] URL: [email or web address] Date: 06/26/2023 Prepared by: Vernon Prey April Kirstie Peri  Exercises - Shoulder External Rotation in 45 Degrees Abduction  - 1 x daily - 7 x weekly - 2 sets - 10 reps - Standing Bilateral Low Shoulder Row with Anchored Resistance  - 1 x daily - 7 x weekly - 2 sets - 10 reps - Shoulder extension with resistance - Neutral  - 1 x daily - 7 x weekly - 2 sets - 10 reps - Seated Cervical Retraction  - 1 x daily - 7 x weekly - 2 sets - 10 reps - Modified Superman on Table  - 1 x daily - 7 x weekly - 3 sets - 10 reps - 2-3 second hold - Cat Cow to Child's Pose  - 1 x daily - 7 x weekly - 1 sets - 10 reps - Bird Dog  - 1 x daily - 7 x weekly - 2 sets - 10 reps  ASSESSMENT:  CLINICAL IMPRESSION: Emphasis of skilled PT session on periscapular strength, core stability in standing and proper lifting technique. Pt continues to demonstrate slight shoulder shrug on R side w/activity, but did reduce w/verbal cues this date. Pt could benefit from continued lifting practice to avoid strain on low back and ensure proper BUE positioning, as this is difficult for pt. Continue POC.   OBJECTIVE IMPAIRMENTS: decreased activity tolerance, decreased endurance, decreased mobility, decreased ROM, decreased strength, increased fascial restrictions, increased muscle spasms, impaired UE functional use, improper body mechanics, postural dysfunction, and pain.    GOALS: Goals reviewed with patient? Yes  SHORT TERM GOALS: Target date: 07/03/2023   Pt will be ind with initial HEP Baseline: Goal status: MET  2.  Pt will demo full shoulder abd AROM bilaterally Baseline:  Grossly ~130 deg bilat Goal status: IN PROGRESS (see above - 07/03/23)  3.  Pt will report no shoulder pain/discomfort at rest Baseline: 1-2 Goal status: IN PROGRESS (remains at a 1-2 on 07/03/23)   LONG TERM GOALS: Target date: 07/24/2023   Pt will be ind with management and progression of HEP Baseline:  Goal status: INITIAL  2.  Pt will demo at least 4/5 shoulder and midback strength for improved postural stability Baseline:  Goal status: INITIAL  3.  Pt will be able to tolerate standing at his standing desk at work for >20 min without increased pain/tension in c-spine and shoulders Baseline:  Goal status: INITIAL  4.  Pt will have improved QuickDASH to </=35% to demo MCID Baseline:  Goal status: INITIAL  5.  Pt will be able to lift and carry at least 45# to carry his child at home with pain </=2/10 Baseline:  Goal status: INITIAL   PLAN:  PT FREQUENCY: 1x/week  PT DURATION: 6 weeks  PLANNED INTERVENTIONS: 97164- PT Re-evaluation, 97110-Therapeutic exercises, 97530- Therapeutic activity, 97112- Neuromuscular re-education, 97535- Self Care, 81191- Manual therapy, 97014- Electrical stimulation (unattended), Patient/Family education, Taping, Dry Needling, Joint mobilization, Spinal mobilization, Cryotherapy, and Moist heat  PLAN FOR NEXT SESSION: Recert. Assess response to HEP. Continue to work on Quarry manager. Watch for upper trap compensation. Consider manual work as indicated (use caution as pt states this can sometimes cause increased inflammation for him as well as using heat). Continue core/trunk strengthening/stabilization.   Jill Alexanders Haizley Cannella, PT, DPT 07/10/2023, 8:45 AM

## 2023-07-17 ENCOUNTER — Ambulatory Visit: Payer: Medicaid Other | Admitting: Physical Therapy

## 2023-07-17 ENCOUNTER — Telehealth: Payer: Self-pay | Admitting: Neurology

## 2023-07-17 VITALS — BP 113/77 | HR 82

## 2023-07-17 DIAGNOSIS — R42 Dizziness and giddiness: Secondary | ICD-10-CM

## 2023-07-17 NOTE — Telephone Encounter (Signed)
Pls let him know that dizziness can be caused by so many different things, from blood pressure to inner ear issues. Would recommend seeing PCP first, thanks

## 2023-07-17 NOTE — Therapy (Signed)
OUTPATIENT PHYSICAL THERAPY SHOULDER TREATMENT - ARRIVED NO CHARGE   Patient Name: Charles Beck MRN: [id number] DOB:1991/04/27, 32 y.o., male Today's Date: 07/17/2023  END OF SESSION:  PT End of Session - 07/17/23 0812     Visit Number 5   Arrived no charge   Number of Visits 6    Date for PT Re-Evaluation 07/24/23    Authorization Type Medicaid [employer]    PT Start Time 0801    PT Stop Time 0816    PT Time Calculation (min) 15 min    Activity Tolerance Other (comment)   Vertigo, dizziness   Behavior During Therapy Anxious              Past Medical History:  Diagnosis Date   Anxiety attack    Cellulitis    Lymphedema    Phlebitis    Seizures (HCC)    Past Surgical History:  Procedure Laterality Date   CATARACT EXTRACTION Left    TONSILLECTOMY AND ADENOIDECTOMY     Patient Active Problem List   Diagnosis Date Noted   Maxillary sinusitis 06/24/2022   Dyspnea 06/24/2022   Cellulitis 12/13/2021   Pain, dental 12/13/2021   Seizure disorder (HCC) 05/14/2021   Chronic acquired lymphedema 05/14/2021   Chronic venous insufficiency 05/14/2021   Vitamin D deficiency 08/10/2020    PCP: Everrett Coombe, DO  REFERRING PROVIDER: Van Clines, MD  REFERRING DIAG: M25.511 (ICD-10-CM) - Right shoulder pain, unspecified chronicity R29.898 (ICD-10-CM) - Right arm weakness  THERAPY DIAG:  Dizziness and giddiness  Rationale for Evaluation and Treatment: Rehabilitation  ONSET DATE: 3 years ago  SUBJECTIVE:                                                                                                                                                                                      SUBJECTIVE STATEMENT: Pt reports feeling very dizzy today. Had to go into work late on Monday due to vertigo. States it has gotten a bit better but he is still quite dizzy and nauseous.   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 Yes: NPRS scale: 1-2 /10 Pain location: upper shoulders bilaterally, posterior neck Pain description: "twitch", "weakness" Aggravating factors: working at desk, push ups, holding/lifting for prolonged periods  Relieving factors: Massage (worried about causing inflammation with swelling), ice  PRECAUTIONS: Seizure  RED FLAGS: None   WEIGHT BEARING RESTRICTIONS: No  FALLS:  Has patient fallen in last 6 months? 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  PATIENT SURVEYS:  QuickDASH Score: 45.5 / 100 = 45.5 %  POSTURE: Rounded shoulders, flattened thoracic spine  UPPER EXTREMITY ROM:   ROM Right eval Left eval Right/Left  Shoulder flexion A: 160 A: 160   Shoulder extension A: 53 A: 55   Shoulder abduction A: 130* P: 150 A: 130* P: 150 A: 145/145   Shoulder adduction     Shoulder internal rotation A: T8* A: T8 A: T8/T8  Shoulder external rotation A: T3 A: T3   Middle trapezius     Lower trapezius     Elbow flexion     Elbow extension     Wrist flexion     Wrist extension     Wrist ulnar deviation     Wrist radial deviation     Wrist pronation     Wrist supination     Grip strength (lbs)  76   (Blank rows = not tested, * = concordant pain)  UPPER EXTREMITY MMT:  MMT Right eval Left eval  Shoulder flexion 5 5  Shoulder extension 3+ 3+  Shoulder abduction 5* 5  Shoulder adduction    Shoulder internal rotation 5 5  Shoulder external rotation 5 5  Middle trapezius 3+ 3+  Lower trapezius 3 3+  Elbow flexion 5 5  Elbow extension 4 5  Wrist flexion    Wrist extension     Wrist ulnar deviation    Wrist radial deviation    Wrist pronation    Wrist supination    Grip strength (lbs) 71.4, 64.1, 60.1 67.9, 61.5, 57.3  (Blank rows = not tested, * = concordant pain)  SHOULDER SPECIAL TESTS: Impingement tests: Painful arc test: negative SLAP lesions: Biceps load test: negative Instability tests: Posterior drawer test: negative Rotator cuff assessment: Full can test: negative, External rotation lag sign: negative, and Internal rotation lag sign: negative Biceps assessment: Speed's test: negative  JOINT MOBILITY TESTING:  WNL  PALPATION:  UT tightness noted bilaterally and cervical paraspinal tightness  VITALS  Vitals:   07/17/23 0807 07/17/23 0810  BP: 109/75 113/77  Pulse: 67 82      TODAY'S TREATMENT:        Arrived no charge   Assessed vitals (See above) and WNL. Pt reports he does not feel well enough to do PT but did not want to cancel. Pt drove himself to appointment. Recommended pt go to ER if his symptoms continue to worsen. Pt states his vertigo has been correlated w/seizures in the past, but he has his seizures in his sleep and has no way of knowing if he had one. Pt reports he does not feel as though he has had a seizure. Pt suspects he has a lymphedema flare up as he feels a lot of pressure in occipital region and was told this could happen. Continued to encourage pt to go to ER if his symptoms worsen, pt verbalized understanding.    PATIENT EDUCATION: Education details: Continue HEP  Person educated: Patient Education method: Explanation Education comprehension: verbalized understanding, returned demonstration, and needs further education  HOME EXERCISE PROGRAM: Access Code: [email or web address] URL: [email or web address] Date: 06/26/2023 Prepared by: Vernon Prey April Kirstie Peri  Exercises - Shoulder External Rotation in 45 Degrees Abduction  - 1 x daily - 7 x weekly -  2 sets - 10 reps - Standing Bilateral Low Shoulder Row with  Anchored Resistance  - 1 x daily - 7 x weekly - 2 sets - 10 reps - Shoulder extension with resistance - Neutral  - 1 x daily - 7 x weekly - 2 sets - 10 reps - Seated Cervical Retraction  - 1 x daily - 7 x weekly - 2 sets - 10 reps - Modified Superman on Table  - 1 x daily - 7 x weekly - 3 sets - 10 reps - 2-3 second hold - Cat Cow to Child's Pose  - 1 x daily - 7 x weekly - 1 sets - 10 reps - Bird Dog  - 1 x daily - 7 x weekly - 2 sets - 10 reps  ASSESSMENT:  CLINICAL IMPRESSION: Arrived no charge due to reports of vertigo.   OBJECTIVE IMPAIRMENTS: decreased activity tolerance, decreased endurance, decreased mobility, decreased ROM, decreased strength, increased fascial restrictions, increased muscle spasms, impaired UE functional use, improper body mechanics, postural dysfunction, and pain.    GOALS: Goals reviewed with patient? Yes  SHORT TERM GOALS: Target date: 07/03/2023   Pt will be ind with initial HEP Baseline: Goal status: MET  2.  Pt will demo full shoulder abd AROM bilaterally Baseline: Grossly ~130 deg bilat Goal status: IN PROGRESS (see above - 07/03/23)  3.  Pt will report no shoulder pain/discomfort at rest Baseline: 1-2 Goal status: IN PROGRESS (remains at a 1-2 on 07/03/23)   LONG TERM GOALS: Target date: 07/24/2023   Pt will be ind with management and progression of HEP Baseline:  Goal status: INITIAL  2.  Pt will demo at least 4/5 shoulder and midback strength for improved postural stability Baseline:  Goal status: INITIAL  3.  Pt will be able to tolerate standing at his standing desk at work for >20 min without increased pain/tension in c-spine and shoulders Baseline:  Goal status: INITIAL  4.  Pt will have improved QuickDASH to </=35% to demo MCID Baseline:  Goal status: INITIAL  5.  Pt will be able to lift and carry at least 45# to carry his child at home with pain </=2/10 Baseline:  Goal status: INITIAL   PLAN:  PT FREQUENCY:  1x/week  PT DURATION: 6 weeks  PLANNED INTERVENTIONS: 97164- PT Re-evaluation, 97110-Therapeutic exercises, 97530- Therapeutic activity, 97112- Neuromuscular re-education, 97535- Self Care, 96295- Manual therapy, 97014- Electrical stimulation (unattended), Patient/Family education, Taping, Dry Needling, Joint mobilization, Spinal mobilization, Cryotherapy, and Moist heat  PLAN FOR NEXT SESSION: Recert (to cover last two scheduled appointments). Assess vertigo symptoms?  Assess response to HEP. Continue to work on [employer]ar[employer]. Watch for upper trap compensation. Consider manual work as indicated (use caution as pt states this can sometimes cause increased inflammation for him as well as using heat). Continue core/trunk strengthening/stabilization.   Jill Alexanders Jireh Vinas, PT, DPT 07/17/2023, 8:20 AM

## 2023-07-17 NOTE — Telephone Encounter (Signed)
Pt called in stating he has been having bad dizziness since Monday afternoon. It seems worse in the mornings.

## 2023-07-17 NOTE — Telephone Encounter (Signed)
Pt called no answer left a voice mail to call the office back  °

## 2023-07-17 NOTE — Telephone Encounter (Signed)
Pt called an informed that dizziness can be caused by so many different things, from blood pressure to inner ear issues. Would recommend seeing PCP first, pt verbalized understanding

## 2023-07-22 ENCOUNTER — Ambulatory Visit: Payer: Medicaid Other | Admitting: Family Medicine

## 2023-07-22 ENCOUNTER — Encounter: Payer: Self-pay | Admitting: Family Medicine

## 2023-07-22 VITALS — BP 121/78 | HR 80 | Temp 97.7°F | Ht 63.0 in | Wt 170.1 lb

## 2023-07-22 DIAGNOSIS — H6990 Unspecified Eustachian tube disorder, unspecified ear: Secondary | ICD-10-CM | POA: Insufficient documentation

## 2023-07-22 DIAGNOSIS — H6991 Unspecified Eustachian tube disorder, right ear: Secondary | ICD-10-CM | POA: Diagnosis not present

## 2023-07-22 MED ORDER — PREDNISONE 20 MG PO TABS: 20.0000 mg | ORAL_TABLET | Freq: Two times a day (BID) | ORAL | 0 refills | Status: AC

## 2023-07-22 NOTE — Progress Notes (Signed)
Charles Beck - 32 y.o. male MRN [id number]  Date of birth: 1991/02/27  Subjective Chief Complaint  Patient presents with   Ear Pain    HPI Charles Beck is a 32 y.o. male here today with complaint of R ear pain.  Symptoms started initially as pressure and some vertigo symptoms.  Still with some pressure but overall improved. Vertigo resolved. Marland Kitchen  He denies drainage from the ear.  Sounds are muffled some on the R.   ROS:  A comprehensive ROS was completed and negative except as noted per HPI  Allergies  Allergen Reactions   Doxycycline Shortness Of Breath   Keflex [Cephalexin] Shortness Of Breath   Banana Swelling and Other (See Comments)    Lips swell   Latex Swelling and Other (See Comments)    "Skin irritation," also   Other Itching, Swelling and Other (See Comments)    Melons - Lips and ears swell, but no breathing issues  Green tea - Body swells and dizziness, but no breathing issues  Insect bites/stings - Systemic inflammation and itching, also   Tape Other (See Comments)    Skin redness    Past Medical History:  Diagnosis Date   Anxiety attack    Cellulitis    Lymphedema    Phlebitis    Seizures (HCC)     Past Surgical History:  Procedure Laterality Date   CATARACT EXTRACTION Left    TONSILLECTOMY AND ADENOIDECTOMY      Social History   Socioeconomic History   Marital status: Married    Spouse name: Not on file   Number of children: Not on file   Years of education: Not on file   Highest education level: Not on file  Occupational History   Not on file  Tobacco Use   Smoking status: Never    Passive exposure: Never   Smokeless tobacco: Never  Vaping Use   Vaping status: Never Used  Substance and Sexual Activity   Alcohol use: Yes    Alcohol/week: 1.0 standard drink of alcohol    Types: 1 Glasses of wine per week    Comment: occ   Drug use: Never   Sexual activity: Yes    Partners: Female  Other Topics Concern   Not on file  Social History  Narrative   Right handed   2 story home   Lives with family wife 4 kids   Social Drivers of Corporate investment banker Strain: Not on file  Food Insecurity: Not on file  Transportation Needs: Not on file  Physical Activity: Not on file  Stress: Not on file  Social Connections: Not on file    Family History  Problem Relation Age of Onset   Jaundice Mother     Health Maintenance  Topic Date Due   HIV Screening  Never done   Hepatitis C Screening  Never done   INFLUENZA VACCINE  Never done   COVID-19 Vaccine (1 - 2024-25 season) Never done   DTaP/Tdap/Td (5 - Tdap) 07/21/2024 (Originally 12/13/1999)   HPV VACCINES  Aged Out     ----------------------------------------------------------------------------------------------------------------------------------------------------------------------------------------------------------------- Physical Exam BP 121/78 (BP Location: Left Arm, Patient Position: Sitting, Cuff Size: Normal)   Pulse 80   Temp 97.7 F (36.5 C) (Oral)   Ht 5\' 3"  (1.6 m)   Wt 170 lb 1.9 oz (77.2 kg)   SpO2 98%   BMI 30.14 kg/m   Physical Exam Constitutional:      Appearance: Normal appearance.  HENT:  Head: Normocephalic and atraumatic.     Right Ear: Ear canal normal.     Left Ear: Tympanic membrane and ear canal normal.     Ears:     Comments: R serous effusion Eyes:     General: No scleral icterus. Cardiovascular:     Rate and Rhythm: Normal rate and regular rhythm.  Pulmonary:     Effort: Pulmonary effort is normal.     Breath sounds: Normal breath sounds.  Musculoskeletal:     Cervical back: Neck supple.  Lymphadenopathy:     Cervical: No cervical adenopathy.  Neurological:     Mental Status: He is alert.     ------------------------------------------------------------------------------------------------------------------------------------------------------------------------------------------------------------------- Assessment  and Plan  Eustachian tube dysfunction Adding 5 day burst of prednisone.  Recommend addition of flonase daily as well.    Meds ordered this encounter  Medications   predniSONE (DELTASONE) 20 MG tablet    Sig: Take 1 tablet (20 mg total) by mouth 2 (two) times daily with a meal for 5 days.    Dispense:  10 tablet    Refill:  0    No follow-ups on file.    This visit occurred during the SARS-CoV-2 public health emergency.  Safety protocols were in place, including screening questions prior to the visit, additional usage of staff PPE, and extensive cleaning of exam room while observing appropriate contact time as indicated for disinfecting solutions.

## 2023-07-22 NOTE — Patient Instructions (Signed)
Eustachian Tube Dysfunction  Eustachian tube dysfunction refers to a condition in which a blockage develops in the narrow passage that connects the middle ear to the back of the nose (eustachian tube). The eustachian tube regulates air pressure in the middle ear by letting air move between the ear and nose. It also helps to drain fluid from the middle ear space. Eustachian tube dysfunction can affect one or both ears. When the eustachian tube does not function properly, air pressure, fluid, or both can build up in the middle ear. What are the causes? This condition occurs when the eustachian tube becomes blocked or cannot open normally. Common causes of this condition include: Ear infections. Colds and other infections that affect the nose, mouth, and throat (upper respiratory tract). Allergies. Irritation from cigarette smoke. Irritation from stomach acid coming up into the esophagus (gastroesophageal reflux). The esophagus is the part of the body that moves food from the mouth to the stomach. Sudden changes in air pressure, such as from descending in an airplane or scuba diving. Abnormal growths in the nose or throat, such as: Growths that line the nose (nasal polyps). Abnormal growth of cells (tumors). Enlarged tissue at the back of the throat (adenoids). What increases the risk? You are more likely to develop this condition if: You smoke. You are overweight. You are a child who has: Certain birth defects of the mouth, such as cleft palate. Large tonsils or adenoids. What are the signs or symptoms? Common symptoms of this condition include: A feeling of fullness in the ear. Ear pain. Clicking or popping noises in the ear. Ringing in the ear (tinnitus). Hearing loss. Loss of balance. Dizziness. Symptoms may get worse when the air pressure around you changes, such as when you travel to an area of high elevation, fly on an airplane, or go scuba diving. How is this diagnosed? This  condition may be diagnosed based on: Your symptoms. A physical exam of your ears, nose, and throat. Tests, such as those that measure: The movement of your eardrum. Your hearing (audiometry). How is this treated? Treatment depends on the cause and severity of your condition. In mild cases, you may relieve your symptoms by moving air into your ears. This is called "popping the ears." In more severe cases, or if you have symptoms of fluid in your ears, treatment may include: Medicines to relieve congestion (decongestants). Medicines that treat allergies (antihistamines). Nasal sprays or ear drops that contain medicines that reduce swelling (steroids). A procedure to drain the fluid in your eardrum. In this procedure, a small tube may be placed in the eardrum to: Drain the fluid. Restore the air in the middle ear space. A procedure to insert a balloon device through the nose to inflate the opening of the eustachian tube (balloon dilation). Follow these instructions at home: Lifestyle Do not do any of the following until your health care provider approves: Travel to high altitudes. Fly in airplanes. Work in a pressurized cabin or room. Scuba dive. Do not use any products that contain nicotine or tobacco. These products include cigarettes, chewing tobacco, and vaping devices, such as e-cigarettes. If you need help quitting, ask your health care provider. Keep your ears dry. Wear fitted earplugs during showering and bathing. Dry your ears completely after. General instructions Take over-the-counter and prescription medicines only as told by your health care provider. Use techniques to help pop your ears as recommended by your health care provider. These may include: Chewing gum. Yawning. Frequent, forceful swallowing.   Closing your mouth, holding your nose closed, and gently blowing as if you are trying to blow air out of your nose. Keep all follow-up visits. This is important. Contact a  health care provider if: Your symptoms do not go away after treatment. Your symptoms come back after treatment. You are unable to pop your ears. You have: A fever. Pain in your ear. Pain in your head or neck. Fluid draining from your ear. Your hearing suddenly changes. You become very dizzy. You lose your balance. Get help right away if: You have a sudden, severe increase in any of your symptoms. Summary Eustachian tube dysfunction refers to a condition in which a blockage develops in the eustachian tube. It can be caused by ear infections, allergies, inhaled irritants, or abnormal growths in the nose or throat. Symptoms may include ear pain or fullness, hearing loss, or ringing in the ears. Mild cases are treated with techniques to unblock the ears, such as yawning or chewing gum. More severe cases are treated with medicines or procedures. This information is not intended to replace advice given to you by your health care provider. Make sure you discuss any questions you have with your health care provider. Document Revised: 10/03/2020 Document Reviewed: 10/03/2020 Elsevier Patient Education  2024 [employer]  

## 2023-07-22 NOTE — Assessment & Plan Note (Signed)
Adding 5 day burst of prednisone.  Recommend addition of flonase daily as well.

## 2023-07-24 ENCOUNTER — Ambulatory Visit: Payer: Medicaid Other | Admitting: Physical Therapy

## 2023-07-30 ENCOUNTER — Ambulatory Visit: Payer: Medicaid Other | Admitting: Physical Therapy

## 2023-07-30 DIAGNOSIS — M6281 Muscle weakness (generalized): Secondary | ICD-10-CM

## 2023-07-30 DIAGNOSIS — G8929 Other chronic pain: Secondary | ICD-10-CM

## 2023-07-30 DIAGNOSIS — M542 Cervicalgia: Secondary | ICD-10-CM

## 2023-07-30 DIAGNOSIS — M25512 Pain in left shoulder: Secondary | ICD-10-CM | POA: Diagnosis not present

## 2023-07-30 NOTE — Therapy (Signed)
OUTPATIENT PHYSICAL THERAPY SHOULDER TREATMENT -RECERTIFICATION    Patient Name: Charles Beck MRN: [id number] DOB:[birth year], 32 y.o., male Today's Date: 07/30/2023  END OF SESSION:  PT End of Session - 07/30/23 0802     Visit Number 6    Number of Visits 12   Recert   Date for PT Re-Evaluation 09/10/23   Recert   Authorization Type [employer]    PT Start Time 0800    PT Stop Time 0844    PT Time Calculation (min) 44 min    Activity Tolerance Patient tolerated treatment well    Behavior During Therapy [facility] for tasks assessed/performed               Past Medical History:  Diagnosis Date   Anxiety attack    Cellulitis    Lymphedema    Phlebitis    Seizures (HCC)    Past Surgical History:  Procedure Laterality Date   CATARACT EXTRACTION Left    TONSILLECTOMY AND ADENOIDECTOMY     Patient Active Problem List   Diagnosis Date Noted   Eustachian tube dysfunction 07/22/2023   Maxillary sinusitis 06/24/2022   Dyspnea 06/24/2022   Cellulitis 12/13/2021   Pain, dental 12/13/2021   Seizure disorder (HCC) 05/14/2021   Chronic acquired lymphedema 05/14/2021   Chronic venous insufficiency 05/14/2021   Vitamin D deficiency 08/10/2020    PCP: Everrett Coombe, DO  REFERRING PROVIDER: Van Clines, MD  REFERRING DIAG: M25.511 (ICD-10-CM) - Right shoulder pain, unspecified chronicity R29.898 (ICD-10-CM) - Right arm weakness  THERAPY DIAG:  Muscle weakness (generalized)  Chronic left shoulder pain  Cervicalgia  Rationale for Evaluation and Treatment: Rehabilitation  ONSET DATE: 3 years ago  SUBJECTIVE:                                                                                                                                                                                      SUBJECTIVE STATEMENT: Pt reports feeling very tight today. Has been recovering from an ear infection and has been taking hot showers, which makes his inflammation worse. Has  not been doing much of his exercises. Would like to add more appointments as he feels as though he has regressed in his progress.   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 Yes: NPRS scale: 3/10 Pain location: upper shoulders bilaterally, posterior neck Pain description: "twitch", "weakness" Aggravating factors: working at desk, push ups, holding/lifting for prolonged periods  Relieving factors: Massage (worried about causing inflammation with swelling), ice  PRECAUTIONS: Seizure  RED FLAGS: None  WEIGHT BEARING RESTRICTIONS: No  FALLS:  Has patient fallen in last 6 months? 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  PATIENT SURVEYS:  QuickDASH Score: 45.5 / 100 = 45.5 %  POSTURE: Rounded shoulders, flattened thoracic spine  UPPER EXTREMITY ROM:   ROM Right eval Left eval Right/Left  Shoulder flexion A: 160 A: 160   Shoulder extension A: 53 A: 55   Shoulder abduction A: 130* P: 150 A: 130* P: 150 A: 145/145   Shoulder adduction     Shoulder internal rotation A: T8* A: T8 A: T8/T8  Shoulder external rotation A: T3 A: T3   Middle trapezius     Lower trapezius     Elbow flexion     Elbow extension     Wrist flexion     Wrist extension     Wrist ulnar deviation     Wrist radial deviation     Wrist pronation     Wrist supination     Grip strength (lbs)  76   (Blank rows = not tested, * = concordant pain)  UPPER EXTREMITY MMT:  MMT Right eval Left eval  Shoulder flexion 5 5  Shoulder extension 3+ 3+  Shoulder abduction  5* 5  Shoulder adduction    Shoulder internal rotation 5 5  Shoulder external rotation 5 5  Middle trapezius 3+ 3+  Lower trapezius 3 3+  Elbow flexion 5 5  Elbow extension 4 5  Wrist flexion    Wrist extension    Wrist ulnar deviation    Wrist radial deviation    Wrist pronation    Wrist supination    Grip strength (lbs) 71.4, 64.1, 60.1 67.9, 61.5, 57.3  (Blank rows = not tested, * = concordant pain)  SHOULDER SPECIAL TESTS: Impingement tests: Painful arc test: negative SLAP lesions: Biceps load test: negative Instability tests: Posterior drawer test: negative Rotator cuff assessment: Full can test: negative, External rotation lag sign: negative, and Internal rotation lag sign: negative Biceps assessment: Speed's test: negative  JOINT MOBILITY TESTING:  WNL  PALPATION:  UT tightness noted bilaterally and cervical paraspinal tightness  VITALS  There were no vitals filed for this visit.     TODAY'S TREATMENT:        Ther Ex  UBE for cardiovascular warmup and improved shoulder ROM, x4 minutes fwd and x4 minutes retro on level 2.  With yellow resistance band for improved upper back strength and functional core stability Face pulls, x20 reps, w/mod cues to avoid shoulder shrug and leaning backwards to perform movement.  Seated pallof presses, x15 per side. Min cues to avoid shoulder shrug   Ther Act  LTG Assessment  Quick DASH: 38.6% Discussed POC and goals moving forward. Pt reports he is able to lift his children piggy-back style without pain. Pt cannot stand at his desk for >10-15 minutes without pain or dizziness and would like to continue working on strength training in therapy. Goals updated to reflect pt's personal goals.   PATIENT EDUCATION: Education details: Continue HEP, POC  Person educated: Patient Education method: Explanation Education comprehension: verbalized understanding, returned demonstration, and needs further education  HOME EXERCISE  PROGRAM: Access Code: [email or web address] URL: [email or web address] Date: 06/26/2023 Prepared by: Vernon Prey April Hilda Lias  Nonato  Exercises - Shoulder External Rotation in 45 Degrees Abduction  - 1 x daily - 7 x weekly - 2 sets - 10 reps - Standing Bilateral Low Shoulder Row with Anchored Resistance  - 1 x daily - 7 x weekly - 2 sets - 10 reps - Shoulder extension with resistance - Neutral  - 1 x daily - 7 x weekly - 2 sets - 10 reps - Seated Cervical Retraction  - 1 x daily - 7 x weekly - 2 sets - 10 reps - Modified Superman on Table  - 1 x daily - 7 x weekly - 3 sets - 10 reps - 2-3 second hold - Cat Cow to Child's Pose  - 1 x daily - 7 x weekly - 1 sets - 10 reps - Bird Dog  - 1 x daily - 7 x weekly - 2 sets - 10 reps  ASSESSMENT:  CLINICAL IMPRESSION: Emphasis of skilled PT session on LTG assessment, functional core stability and upper back strength. Pt has met 2 of 5 LTGs, verbalizing independence w/HEP and ability to lift and carry children without pain. Pt did improve his quick DASH to 38.6% from 45.5%, but did not meet his goal. Did not assess MMT this date as pt has been sick and reported feeling more tight today and has been sick. Pt reports he cannot stand longer than 10-15 minutes at his desk without pain or dizziness and would like to continue working on strength training in PT to work on paraspinal stability. Continue POC.   OBJECTIVE IMPAIRMENTS: decreased activity tolerance, decreased endurance, decreased mobility, decreased ROM, decreased strength, increased fascial restrictions, increased muscle spasms, impaired UE functional use, improper body mechanics, postural dysfunction, and pain.    GOALS: Goals reviewed with patient? Yes   LONG TERM GOALS: Target date: 07/24/2023   Pt will be ind with management and progression of HEP Baseline:  Goal status: MET  2.  Pt will demo at least 4/5 shoulder and midback strength for improved postural stability Baseline:  Goal  status: IN PROGRESS  3.  Pt will be able to tolerate standing at his standing desk at work for >20 min without increased pain/tension in c-spine and shoulders Baseline: pt can tolerate 10-15 minutes  Goal status: IN PROGRESS  4.  Pt will have improved QuickDASH to </=35% to demo MCID Baseline: 38.6% (12/24) Goal status: NOT MET  5.  Pt will be able to lift and carry at least 45# to carry his child at home with pain </=2/10 Baseline: pt reports ability w/holding child in piggy-back position for 5-10 minutes without pain  Goal status: MET  NEW SHORT TERM GOALS FOR EXTENDED POC:   Target date: 08/27/2023  Pt will demo at least 4/5 shoulder and midback strength for improved postural stability Baseline:  Goal status: IN PROGRESS   NEW LONG TERM GOALS FOR EXTENDED POC:  Target date: 09/10/2023  Pt will have improved QuickDASH to </=28% to demo MCID Baseline: 45.5%, 38.6% (12/24) Goal status: INITIAL  2.  Pt will be able to tolerate standing at his standing desk at work for >20 min without increased pain/tension in c-spine and shoulders Baseline: pt can tolerate 10-15 minutes  Goal status: IN PROGRESS     PLAN:  PT FREQUENCY: 1x/week  PT DURATION: 6 weeks + 6 weeks (recert)   PLANNED INTERVENTIONS: 28413- PT Re-evaluation, 97110-Therapeutic exercises, 97530- Therapeutic activity, 97112- Neuromuscular re-education, 97535- Self Care, 24401- Manual therapy, 97014- Electrical stimulation (unattended), Patient/Family education, Taping,  Dry Needling, Joint mobilization, Spinal mobilization, Cryotherapy, and Moist heat  PLAN FOR NEXT SESSION: Assess response to HEP. Continue to work on Quarry manager. Watch for upper trap compensation. Consider manual work as indicated (use caution as pt states this can sometimes cause increased inflammation for him as well as using heat). Continue core/trunk strengthening/stabilization.   Jill Alexanders Carlisle Torgeson, PT,  DPT 07/30/2023, 9:00 AM

## 2023-08-06 ENCOUNTER — Ambulatory Visit: Payer: Medicaid Other | Admitting: Physical Therapy

## 2023-08-06 DIAGNOSIS — M25512 Pain in left shoulder: Secondary | ICD-10-CM | POA: Diagnosis not present

## 2023-08-06 DIAGNOSIS — M6281 Muscle weakness (generalized): Secondary | ICD-10-CM

## 2023-08-06 DIAGNOSIS — M542 Cervicalgia: Secondary | ICD-10-CM

## 2023-08-06 DIAGNOSIS — G8929 Other chronic pain: Secondary | ICD-10-CM

## 2023-08-06 NOTE — Therapy (Signed)
 OUTPATIENT PHYSICAL THERAPY SHOULDER TREATMENT    Patient Name: Charles Beck MRN: [id number] DOB:1991-05-20, 32 y.o., male Today's Date: 08/06/2023  END OF SESSION:  PT End of Session - 08/06/23 0804     Visit Number 7    Number of Visits 12   Recert   Date for PT Re-Evaluation 09/10/23   Recert   Authorization Type [employer]    PT Start Time 0802    PT Stop Time 0842    PT Time Calculation (min) 40 min    Activity Tolerance Patient tolerated treatment well    Behavior During Therapy [facility] for tasks assessed/performed                Past Medical History:  Diagnosis Date   Anxiety attack    Cellulitis    Lymphedema    Phlebitis    Seizures (HCC)    Past Surgical History:  Procedure Laterality Date   CATARACT EXTRACTION Left    TONSILLECTOMY AND ADENOIDECTOMY     Patient Active Problem List   Diagnosis Date Noted   Eustachian tube dysfunction 07/22/2023   Maxillary sinusitis 06/24/2022   Dyspnea 06/24/2022   Cellulitis 12/13/2021   Pain, dental 12/13/2021   Seizure disorder (HCC) 05/14/2021   Chronic acquired lymphedema 05/14/2021   Chronic venous insufficiency 05/14/2021   Vitamin D  deficiency 08/10/2020    PCP: Alvia Bring, DO  REFERRING PROVIDER: Georjean Darice HERO, MD  REFERRING DIAG: M25.511 (ICD-10-CM) - Right shoulder pain, unspecified chronicity R29.898 (ICD-10-CM) - Right arm weakness  THERAPY DIAG:  Muscle weakness (generalized)  Chronic left shoulder pain  Cervicalgia  Rationale for Evaluation and Treatment: Rehabilitation  ONSET DATE: 3 years ago  SUBJECTIVE:                                                                                                                                                                                      SUBJECTIVE STATEMENT: Pt reports feeling pretty good today. Feels weak in my nervous system. States he has difficulty holding his phone for >10 minutes, feels shaky. Feels weakness in  his neck and stiffness in upper trap on both sides.   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 Yes: NPRS scale: 1/10 Pain location: upper shoulders bilaterally, posterior neck Pain description: twitch, weakness Aggravating factors: working at desk, push ups, holding/lifting for prolonged periods  Relieving factors: Massage (worried about causing inflammation with swelling), ice  PRECAUTIONS: Seizure  RED FLAGS: None   WEIGHT BEARING RESTRICTIONS: No  FALLS:  Has patient fallen  in last 6 months? 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  PATIENT SURVEYS:  QuickDASH Score: 45.5 / 100 = 45.5 %  POSTURE: Rounded shoulders, flattened thoracic spine  UPPER EXTREMITY ROM:   ROM Right eval Left eval Right/Left  Shoulder flexion A: 160 A: 160   Shoulder extension A: 53 A: 55   Shoulder abduction A: 130* P: 150 A: 130* P: 150 A: 145/145   Shoulder adduction     Shoulder internal rotation A: T8* A: T8 A: T8/T8  Shoulder external rotation A: T3 A: T3   Middle trapezius     Lower trapezius     Elbow flexion     Elbow extension     Wrist flexion     Wrist extension     Wrist ulnar deviation     Wrist radial deviation     Wrist pronation     Wrist supination     Grip strength (lbs)  76   (Blank rows = not tested, * = concordant pain)  UPPER EXTREMITY MMT:  MMT Right eval Left eval  Shoulder flexion 5 5  Shoulder extension 3+ 3+  Shoulder abduction 5* 5  Shoulder adduction    Shoulder internal rotation 5 5  Shoulder  external rotation 5 5  Middle trapezius 3+ 3+  Lower trapezius 3 3+  Elbow flexion 5 5  Elbow extension 4 5  Wrist flexion    Wrist extension    Wrist ulnar deviation    Wrist radial deviation    Wrist pronation    Wrist supination    Grip strength (lbs) 71.4, 64.1, 60.1 67.9, 61.5, 57.3  (Blank rows = not tested, * = concordant pain)  SHOULDER SPECIAL TESTS: Impingement tests: Painful arc test: negative SLAP lesions: Biceps load test: negative Instability tests: Posterior drawer test: negative Rotator cuff assessment: Full can test: negative, External rotation lag sign: negative, and Internal rotation lag sign: negative Biceps assessment: Speed's test: negative  JOINT MOBILITY TESTING:  WNL  PALPATION:  UT tightness noted bilaterally and cervical paraspinal tightness  VITALS  There were no vitals filed for this visit.     TODAY'S TREATMENT:        Ther Ex  UBE for cardiovascular warmup and improved shoulder ROM, x4 minutes fwd and x4 minutes retro on level 2.5.  Cuban presses at wall for improved shoulder mobility and periscapular strength, x12 reps. Pt most challenged w/shoulder IR (R>LUE) and could not maintain contact w/wall throughout w/shoulders. Mild pain reported in low back, min cues to avoid shoulder shrug throughout, especially on R side. Noted shortened upper trap on R side.  PVC pass throughs, x15 reps, for improved shoulder mobility and scapulothoracic mobility. Min cues to avoid shrugging R shoulder and holding breath w/movement. Pt able to perform well once relaxed, no pain reported.  Alt standing march w/5# KB OH hold, 2x5 reps per side per arm. Pt very challenged by this but was able to maintain elbow extension of OH arm throughout. Noted trembling of core throughout and pt reported feeling movement in bilateral hips. No LOB throughout  Slam balls w/6# ball, x15 reps, for improved high amplitude movement, posterior chain strength and shoulder ROM. Min cues to  throw ball close  to body rather than too far forward.     PATIENT EDUCATION: Education details: Continue HEP Person educated: Patient Education method: Explanation Education comprehension: verbalized understanding, returned demonstration, and needs further education  HOME EXERCISE PROGRAM: Access Code: [email or web address] URL: [email or web address] Date: 06/26/2023 Prepared by: Gellen April Earnie Starring  Exercises - Shoulder External Rotation in 45 Degrees Abduction  - 1 x daily - 7 x weekly - 2 sets - 10 reps - Standing Bilateral Low Shoulder Row with Anchored Resistance  - 1 x daily - 7 x weekly - 2 sets - 10 reps - Shoulder extension with resistance - Neutral  - 1 x daily - 7 x weekly - 2 sets - 10 reps - Seated Cervical Retraction  - 1 x daily - 7 x weekly - 2 sets - 10 reps - Modified Superman on Table  - 1 x daily - 7 x weekly - 3 sets - 10 reps - 2-3 second hold - Cat Cow to Child's Pose  - 1 x daily - 7 x weekly - 1 sets - 10 reps - Bird Dog  - 1 x daily - 7 x weekly - 2 sets - 10 reps  ASSESSMENT:  CLINICAL IMPRESSION: Emphasis of skilled PT session on functional mobility, periscapular and posterior chain strength. Pt continues to report tightness in bilateral upper traps and neck as well as nervous system weakness. However, he does state this is improved from eval. Pt demonstrates increased incidence of shoulder shrug on R side but does respond well to verbal cues to relax shoulders and focus on breathing. Pt easily discouraged if unable to perform movement to his standards but does well w/encouraging cues and distraction. Continue POC.   OBJECTIVE IMPAIRMENTS: decreased activity tolerance, decreased endurance, decreased mobility, decreased ROM, decreased strength, increased fascial restrictions, increased muscle spasms, impaired UE functional use, improper body mechanics, postural dysfunction, and pain.    GOALS: Goals reviewed with patient? Yes   LONG TERM GOALS:  Target date: 07/24/2023   Pt will be ind with management and progression of HEP Baseline:  Goal status: MET  2.  Pt will demo at least 4/5 shoulder and midback strength for improved postural stability Baseline:  Goal status: IN PROGRESS  3.  Pt will be able to tolerate standing at his standing desk at work for >20 min without increased pain/tension in c-spine and shoulders Baseline: pt can tolerate 10-15 minutes  Goal status: IN PROGRESS  4.  Pt will have improved QuickDASH to </=35% to demo MCID Baseline: 38.6% (12/24) Goal status: NOT MET  5.  Pt will be able to lift and carry at least 45# to carry his child at home with pain </=2/10 Baseline: pt reports ability w/holding child in piggy-back position for 5-10 minutes without pain  Goal status: MET  NEW SHORT TERM GOALS FOR EXTENDED POC:   Target date: 08/27/2023  Pt will demo at least 4/5 shoulder and midback strength for improved postural stability Baseline:  Goal status: IN PROGRESS   NEW LONG TERM GOALS FOR EXTENDED POC:  Target date: 09/10/2023  Pt will have improved QuickDASH to </=28% to demo MCID Baseline: 45.5%, 38.6% (12/24) Goal status: INITIAL  2.  Pt will be able to tolerate standing at his standing desk at work for >20 min without increased pain/tension in c-spine and shoulders Baseline: pt can tolerate 10-15 minutes  Goal status: IN PROGRESS     PLAN:  PT FREQUENCY: 1x/week  PT DURATION: 6 weeks + 6 weeks (recert)  PLANNED INTERVENTIONS: 97164- PT Re-evaluation, 97110-Therapeutic exercises, 97530- Therapeutic activity, [id number]- Neuromuscular re-education, 97535- Self Care, [id number]- Manual therapy, 97014- Electrical stimulation (unattended), Patient/Family education, Taping, Dry Needling, Joint mobilization, Spinal mobilization, Cryotherapy, and Moist heat  PLAN FOR NEXT SESSION: Assess response to HEP. Continue to work on quarry manager. Watch for upper trap compensation.  Consider manual work as indicated (use caution as pt states this can sometimes cause increased inflammation for him as well as using heat). Continue core/trunk strengthening/stabilization.   Armetta Henri E Ronak Duquette, PT, DPT 08/06/2023, 8:43 AM

## 2023-08-14 ENCOUNTER — Ambulatory Visit: Payer: Medicaid Other | Attending: Family Medicine | Admitting: Physical Therapy

## 2023-08-22 ENCOUNTER — Ambulatory Visit: Payer: Medicaid Other | Attending: Family Medicine | Admitting: Physical Therapy

## 2023-08-22 DIAGNOSIS — M6281 Muscle weakness (generalized): Secondary | ICD-10-CM | POA: Insufficient documentation

## 2023-08-22 DIAGNOSIS — G8929 Other chronic pain: Secondary | ICD-10-CM | POA: Diagnosis present

## 2023-08-22 DIAGNOSIS — M25512 Pain in left shoulder: Secondary | ICD-10-CM | POA: Insufficient documentation

## 2023-08-22 DIAGNOSIS — M542 Cervicalgia: Secondary | ICD-10-CM | POA: Diagnosis present

## 2023-08-22 NOTE — Therapy (Signed)
OUTPATIENT PHYSICAL THERAPY SHOULDER TREATMENT - DISCHARGE SUMMARY   Patient Name: Charles Beck MRN: [id number] DOB:05/07/1991, 33 y.o., male Today's Date: 08/22/2023  PHYSICAL THERAPY DISCHARGE SUMMARY  Visits from Start of Care: 8  Current functional level related to goals / functional outcomes: Independent w/all ADLs without pain    Remaining deficits: Decreased functional strength of postural muscles, decreased activity tolerance    Education / Equipment: HEP   Patient agrees to discharge. Patient goals were partially met. Patient is being discharged due to being pleased with the current functional level.   END OF SESSION:  PT End of Session - 08/22/23 0806     Visit Number 8    Number of Visits 12   Recert   Date for PT Re-Evaluation 09/10/23   Recert   Authorization Type Medicaid [employer]    PT Start Time 0805    PT Stop Time 0829   DC   PT Time Calculation (min) 24 min    Activity Tolerance Patient tolerated treatment well    Behavior During Therapy [facility] for tasks assessed/performed                 Past Medical History:  Diagnosis Date   Anxiety attack    Cellulitis    Lymphedema    Phlebitis    Seizures (HCC)    Past Surgical History:  Procedure Laterality Date   CATARACT EXTRACTION Left    TONSILLECTOMY AND ADENOIDECTOMY     Patient Active Problem List   Diagnosis Date Noted   Eustachian tube dysfunction 07/22/2023   Maxillary sinusitis 06/24/2022   Dyspnea 06/24/2022   Cellulitis 12/13/2021   Pain, dental 12/13/2021   Seizure disorder (HCC) 05/14/2021   Chronic acquired lymphedema 05/14/2021   Chronic venous insufficiency 05/14/2021   Vitamin D deficiency 08/10/2020    PCP: Everrett Coombe, DO  REFERRING PROVIDER: Van Clines, MD  REFERRING DIAG: M25.511 (ICD-10-CM) - Right shoulder pain, unspecified chronicity R29.898 (ICD-10-CM) - Right arm weakness  THERAPY DIAG:  Muscle weakness (generalized)  Chronic left shoulder  pain  Cervicalgia  Rationale for Evaluation and Treatment: Rehabilitation  ONSET DATE: 3 years ago  SUBJECTIVE:                                                                                                                                                                                      SUBJECTIVE STATEMENT: Pt reports doing well. Would like to DC today. Started the gym class at work and has limited his max to 15#, states it is fun and he enjoys it.   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 No  PRECAUTIONS: Seizure  RED FLAGS: None   WEIGHT BEARING RESTRICTIONS: No  FALLS:  Has patient fallen in last 6 months? 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  PATIENT SURVEYS:  QuickDASH Score: 45.5 / 100 = 45.5 %  POSTURE: Rounded shoulders, flattened thoracic spine  UPPER EXTREMITY ROM:   ROM Right eval Left eval Right/Left  Shoulder flexion A: 160 A: 160   Shoulder extension A: 53 A: 55   Shoulder abduction A: 130* P: 150 A: 130* P: 150 A: 145/145   Shoulder adduction     Shoulder internal rotation A: T8* A: T8 A: T8/T8  Shoulder external rotation A: T3 A: T3   Middle trapezius     Lower trapezius     Elbow flexion     Elbow extension     Wrist flexion     Wrist extension     Wrist ulnar deviation     Wrist radial deviation     Wrist pronation     Wrist supination     Grip strength (lbs)  76   (Blank rows = not tested, * = concordant pain)  UPPER EXTREMITY MMT:  MMT Right eval Left eval Right  (1/16)  Left (1/16)  Shoulder flexion 5 5    Shoulder extension 3+ 3+ 4+ 4+  Shoulder abduction 5* 5    Shoulder adduction      Shoulder internal rotation 5 5    Shoulder external rotation 5 5    Middle trapezius 3+ 3+ 4+ 4+  Lower trapezius 3 3+ 4+ 4+  Elbow flexion 5 5    Elbow extension 4 5    Wrist flexion      Wrist extension      Wrist ulnar deviation      Wrist radial deviation      Wrist pronation      Wrist supination      Grip strength (lbs) 71.4, 64.1, 60.1 67.9, 61.5, 57.3    (Blank rows = not tested, * = concordant pain)  SHOULDER SPECIAL TESTS: Impingement tests: Painful arc test: negative SLAP lesions: Biceps load test: negative Instability tests: Posterior drawer test: negative Rotator cuff assessment: Full can test: negative, External rotation lag sign: negative, and Internal rotation lag sign: negative Biceps assessment: Speed's test: negative  JOINT MOBILITY TESTING:  WNL  PALPATION:  UT tightness noted bilaterally and cervical paraspinal tightness  VITALS  There were no vitals filed for this visit.     TODAY'S TREATMENT:        Ther Act LTG Assessment  Assessed UE MMT (see above)  QuickDash: 20.5% Pt reports he is unable to stand at his standing desk for >20 minutes without pain, is working on "foot detox" to assist with standing.   Ther Ex  Per pt request, standing pallof presses w/orange resistance band at ballet bar, x12 reps per side. No shoulder shrug compensation noted  Standing pallof press rotations w/orange resistance band, x10 per side. Pt reports this feels "foreign" on L side and pt focused on proper breathing throughout.    PATIENT EDUCATION: Education details: Continue HEP, goal results, education on how  to return to PT in future if needed  Person educated: Patient Education method: Explanation Education comprehension: verbalized understanding  HOME EXERCISE PROGRAM: Access Code: [email or web address] URL:  [email or web address] Date: 06/26/2023 Prepared by: Vernon Prey April Kirstie Peri  Exercises - Shoulder External Rotation in 45 Degrees Abduction  - 1 x daily - 7 x weekly - 2 sets - 10 reps - Standing Bilateral Low Shoulder Row with Anchored Resistance  - 1 x daily - 7 x weekly - 2 sets - 10 reps - Shoulder extension with resistance - Neutral  - 1 x daily - 7 x weekly - 2 sets - 10 reps - Seated Cervical Retraction  - 1 x daily - 7 x weekly - 2 sets - 10 reps - Modified Superman on Table  - 1 x daily - 7 x weekly - 3 sets - 10 reps - 2-3 second hold - Cat Cow to Child's Pose  - 1 x daily - 7 x weekly - 1 sets - 10 reps - Bird Dog  - 1 x daily - 7 x weekly - 2 sets - 10 reps  ASSESSMENT:  CLINICAL IMPRESSION: Emphasis of skilled PT session on LTG assessment and DC from PT. Pt reports that due to his work schedule, it is difficult to make appointments and he feels ready for DC. Pt did meet his MMT and QuickDash goal, indicative of improved strength w/reduced pain levels. Pt reports he has not met his standing goal but is working on "detoxing his feet". Pt denied pain today and has began participating in workout classes at his workplace, which he enjoys.   OBJECTIVE IMPAIRMENTS: decreased activity tolerance, decreased endurance, decreased mobility, decreased ROM, decreased strength, increased fascial restrictions, increased muscle spasms, impaired UE functional use, improper body mechanics, postural dysfunction, and pain.    GOALS: Goals reviewed with patient? Yes   LONG TERM GOALS: Target date: 07/24/2023   Pt will be ind with management and progression of HEP Baseline:  Goal status: MET  2.  Pt will demo at least 4/5 shoulder and midback strength for improved postural stability Baseline:  Goal status: IN PROGRESS  3.  Pt will be able to tolerate standing at his standing desk at work for >20 min without increased pain/tension in c-spine and shoulders Baseline: pt can  tolerate 10-15 minutes  Goal status: IN PROGRESS  4.  Pt will have improved QuickDASH to </=35% to demo MCID Baseline: 38.6% (12/24) Goal status: NOT MET  5.  Pt will be able to lift and carry at least 45# to carry his child at home with pain </=2/10 Baseline: pt reports ability w/holding child in piggy-back position for 5-10 minutes without pain  Goal status: MET  NEW SHORT TERM GOALS FOR EXTENDED POC:   Target date: 08/27/2023  Pt will demo at least 4/5 shoulder and midback strength for improved postural stability Baseline:  Goal status: MET   NEW LONG TERM GOALS FOR EXTENDED POC:  Target date: 09/10/2023  Pt will have improved QuickDASH to </=28% to demo MCID Baseline: 45.5%, 38.6% (12/24); 20.5%  Goal status: MET  2.  Pt will be able to tolerate standing at his standing desk at work for >20 min without increased pain/tension in c-spine and shoulders Baseline: pt can tolerate 10-15 minutes  Goal status: NOT MET     PLAN:  PT FREQUENCY: 1x/week  PT DURATION: 6 weeks + 6 weeks (recert)   PLANNED INTERVENTIONS: 74259- PT Re-evaluation, 97110-Therapeutic exercises, 97530- Therapeutic activity, [id number]- Neuromuscular re-education, 97535-  Self Care, 82956- Manual therapy, 97014- Electrical stimulation (unattended), Patient/Family education, Taping, Dry Needling, Joint mobilization, Spinal mobilization, Cryotherapy, and Moist heat    Adriona Kaney E Joycelyn Liska, PT, DPT 08/22/2023, 8:29 AM

## 2023-08-29 ENCOUNTER — Ambulatory Visit: Payer: Medicaid Other | Admitting: Physical Therapy

## 2023-09-04 ENCOUNTER — Ambulatory Visit: Payer: Medicaid Other | Admitting: Physical Therapy

## 2023-09-11 ENCOUNTER — Ambulatory Visit: Payer: Medicaid Other | Admitting: Physical Therapy

## 2024-02-20 ENCOUNTER — Ambulatory Visit: Payer: Self-pay

## 2024-02-20 NOTE — Telephone Encounter (Signed)
 FYI Only or Action Required?: FYI only for provider.  Patient was last seen in primary care on 07/22/2023 by Alvia Bring, DO.  Called Nurse Triage reporting Facial Swelling.  Symptoms began several days ago.  Interventions attempted: Nothing.  Symptoms are: unchanged.  Facial swelling, right side of face and neck. Mild. Asking to be worked in today.No SOB or swallowing difficulty. Will go to ED for worsening of symptoms.  Triage Disposition: See Physician Within 24 Hours  Patient/caregiver understands and will follow disposition?: Yes     Copied from CRM [id number]. Topic: Clinical - Red Word Triage >> Feb 20, 2024 12:07 PM Charles Beck wrote: Red Word that prompted transfer to Nurse Triage: swelling in head and neck for the last 2 or 3 days but noticeably painful today. Patient has a Chronic Lymphatic issue. Patient does not want to the ER. Reason for Disposition  Face swelling is painful to touch  Answer Assessment - Initial Assessment Questions 1. ONSET: When did the swelling start? (e.g., minutes, hours, days)     2 days ago 2. LOCATION: What part of the face is swollen? (e.g., cheek, entire face, jaw joint area, under jaw)     Cheek, right side of head 3. SEVERITY: How swollen is it?     Mild  4. ITCHING: Is there any itching? If Yes, ask: How much?   (Scale 1-10; mild, moderate or severe)     no 5. PAIN: Is the swelling painful to touch? If Yes, ask: How painful is it?   (Scale 0-10; mild, moderate or severe)     irritated 6. FEVER: Do you have a fever? If Yes, ask: What is it, how was it measured, and when did it start?      no 7. CAUSE: What do you think is causing the face swelling?     unsure 8. NEW MEDICINES: Have there been any new medicines started recently?     no 9. RECURRENT SYMPTOM: Have you had face swelling before? If Yes, ask: When was the last time? What happened that time?     yes 10. OTHER SYMPTOMS: Do you have any other  symptoms? (e.g., leg swelling, toothache)       Memory loss 11. PREGNANCY: Is there any chance you are pregnant? When was your last menstrual period?       N/a  Protocols used: Face Swelling-A-AH

## 2024-02-21 ENCOUNTER — Encounter: Payer: Self-pay | Admitting: Emergency Medicine

## 2024-02-21 ENCOUNTER — Ambulatory Visit
Admission: EM | Admit: 2024-02-21 | Discharge: 2024-02-21 | Disposition: A | Discharge status: Home / Self Care | Attending: Nurse Practitioner | Admitting: Nurse Practitioner

## 2024-02-21 DIAGNOSIS — I890 Lymphedema, not elsewhere classified: Secondary | ICD-10-CM

## 2024-02-21 DIAGNOSIS — R42 Dizziness and giddiness: Secondary | ICD-10-CM

## 2024-02-21 DIAGNOSIS — R5383 Other fatigue: Secondary | ICD-10-CM

## 2024-02-21 DIAGNOSIS — I872 Venous insufficiency (chronic) (peripheral): Secondary | ICD-10-CM

## 2024-02-21 DIAGNOSIS — M7912 Myalgia of auxiliary muscles, head and neck: Secondary | ICD-10-CM

## 2024-02-21 DIAGNOSIS — R531 Weakness: Secondary | ICD-10-CM | POA: Diagnosis not present

## 2024-02-21 LAB — POCT URINALYSIS DIP (MANUAL ENTRY)
Bilirubin, UA: NEGATIVE
Blood, UA: NEGATIVE
Glucose, UA: NEGATIVE mg/dL
Ketones, POC UA: NEGATIVE mg/dL
Leukocytes, UA: NEGATIVE
Nitrite, UA: NEGATIVE
Spec Grav, UA: 1.015 (ref 1.010–1.025)
Urobilinogen, UA: 0.2 U/dL
pH, UA: 8 (ref 5.0–8.0)

## 2024-02-21 LAB — POC SARS CORONAVIRUS 2 AG -  ED: SARS Coronavirus 2 Ag: NEGATIVE

## 2024-02-21 LAB — POCT FASTING CBG KUC MANUAL ENTRY: POCT Glucose (KUC): 87 mg/dL (ref 70–99)

## 2024-02-21 MED ORDER — METHOCARBAMOL 500 MG PO TABS: 500.0000 mg | ORAL_TABLET | Freq: Two times a day (BID) | ORAL | 0 refills | Status: DC

## 2024-02-21 NOTE — ED Triage Notes (Addendum)
 Pt c/o right neck swelling and occasion pain and pressure in neck for 2-3 days. States he has been feeling more fatigued, dizzy than normal. It is difficult for him to hold phone to ear and to sit up right in chair  Pt does have hx of seizure and lymphatic issue.

## 2024-02-21 NOTE — Discharge Instructions (Signed)
 You were evaluated today for right-sided neck swelling, pressure, fatigue, dizziness, low-grade headache, and overall weakness that have been present for the past three days. Based on your history of chronic lymphatic and venous insufficiency, your symptoms may be due to an increase in lymphatic congestion, although other serious causes were considered and ruled out. Your exam showed mild muscle tightness in the right side of your neck but no significant swelling, redness, or limitation in movement. Your neurological exam was normal, and your vital signs, blood glucose, COVID-19 test, and urine tests were all within normal limits. You are alert, oriented, and stable at this time.  You were prescribed Robaxin to help relieve the muscular tightness in your neck. You may also apply warm compresses to the area and gently stretch the neck muscles as tolerated. Be sure to stay hydrated, get adequate rest, and avoid positions that worsen your symptoms.   If your symptoms persist or worsen, follow up with your primary care provider for further evaluation.   Go to the emergency room right away if you experience increasing neck swelling, difficulty breathing, chest pain, persistent vomiting, or new neurological symptoms such as vision changes, severe headache, slurred speech, or weakness on one side of the body.

## 2024-02-21 NOTE — ED Provider Notes (Signed)
 [facility]    CSN: [id number] Arrival date & time: 02/21/24  9057      History   Chief Complaint Chief Complaint  Patient presents with   Dizziness   Lymphadenopathy    HPI Charles Beck is a 33 y.o. male.   Discussed the use of AI scribe software for clinical note transcription with the patient, who gave verbal consent to proceed.   Chronic acquired lymphedema, chronic venous Insufficiency   Patient with a history of chronic acquired lymphedema, chronic venous insufficiency, and seizure disorder presents with progressively worsening symptoms over the past three days, including diffuse swelling, lethargy, and significant dizziness. He describes the swelling as centered around his head, which he refers to as the epicenter of the issue. He reports right-sided neck pressure and swelling for the past couple of days, associated with pain in the neck that worsens when lying down or sitting in certain positions. The neck discomfort is described as throbbing with limited range of motion. He also notes a clogged sensation in the right ear, a slight sore throat yesterday, a low-grade headache, and mild nausea without vomiting. Dizziness is particularly severe today and accompanied by extreme weakness. He also reports palpitations, shortness of breath, and chronic bilateral lower extremity swelling that is unchanged. He denies fever, ear pain, or chest pain. He has a history of grand mal seizures from 2020 to 2022, which occurred exclusively during sleep. He has been seizure-free for the past three years while maintained on Keppra  and Trileptal . He denies any sick exposures or recent travel.   The following portions of the patient's history were reviewed and updated as appropriate: allergies, current medications, past family history, past medical history, past social history, past surgical history, and problem list.      Past Medical History:  Diagnosis Date   Anxiety attack     Cellulitis    Lymphedema    Phlebitis    Seizures (HCC)     Patient Active Problem List   Diagnosis Date Noted   Eustachian tube dysfunction 07/22/2023   Maxillary sinusitis 06/24/2022   Dyspnea 06/24/2022   Cellulitis 12/13/2021   Pain, dental 12/13/2021   Seizure disorder (HCC) 05/14/2021   Chronic acquired lymphedema 05/14/2021   Chronic venous insufficiency 05/14/2021   Vitamin D  deficiency 08/10/2020    Past Surgical History:  Procedure Laterality Date   CATARACT EXTRACTION Left    TONSILLECTOMY AND ADENOIDECTOMY         Home Medications    Prior to Admission medications   Medication Sig Start Date End Date Taking? Authorizing Provider  methocarbamol (ROBAXIN) 500 MG tablet Take 1 tablet (500 mg total) by mouth 2 (two) times daily. 02/21/24  Yes Iola Lukes, FNP  ibuprofen (ADVIL) 200 MG tablet Take 400 mg by mouth every 6 (six) hours as needed for mild pain (pain score 1-3).    [provider]  levETIRAcetam  (KEPPRA ) 500 MG tablet TAKE 3 TABLETS BY MOUTH TWICE A DAY 05/24/23   Georjean Darice HERO, MD  magnesium 30 MG tablet Take 30 mg by mouth daily.    [provider]  Multiple Vitamin (STRESS FORMULA) TABS Take 1 tablet by mouth See admin instructions. Take 1 tablet by mouth one to two times a day    [provider]  oxcarbazepine  (TRILEPTAL ) 600 MG tablet TAKE 1 TABLET BY MOUTH 2 TIMES A DAY 05/24/23   Georjean Darice HERO, MD    Family History Family History  Problem Relation Age of  Onset   Jaundice Mother     Social History Social History   Tobacco Use   Smoking status: Never    Passive exposure: Never   Smokeless tobacco: Never  Vaping Use   Vaping status: Never Used  Substance Use Topics   Alcohol use: Yes    Alcohol/week: 1.0 standard drink of alcohol    Types: 1 Glasses of wine per week    Comment: occ   Drug use: Never     Allergies   Doxycycline , Keflex [cephalexin], Banana, Latex, Other, and Tape   Review  of Systems Review of Systems  Constitutional:  Positive for fever.  HENT:  Positive for sore throat (very mild). Negative for ear pain (right ear feels clogged).   Respiratory:  Positive for shortness of breath.   Cardiovascular:  Positive for palpitations and leg swelling. Negative for chest pain.  Gastrointestinal:  Positive for nausea (mild). Negative for vomiting.  Musculoskeletal:  Positive for neck pain (right sided pain and swelling).  Neurological:  Positive for dizziness, weakness (generalized) and headaches (mild). Negative for seizures.  All other systems reviewed and are negative.    Physical Exam Triage Vital Signs ED Triage Vitals  Encounter Vitals Group     BP 02/21/24 0956 126/75     Girls Systolic BP Percentile --      Girls Diastolic BP Percentile --      Boys Systolic BP Percentile --      Boys Diastolic BP Percentile --      Pulse Rate 02/21/24 0956 73     Resp 02/21/24 0956 20     Temp 02/21/24 0956 98 F (36.7 C)     Temp Source 02/21/24 0956 Oral     SpO2 02/21/24 0956 96 %     Weight --      Height --      Head Circumference --      Peak Flow --      Pain Score 02/21/24 0959 2     Pain Loc --      Pain Education --      Exclude from Growth Chart --    No data found.  Updated Vital Signs BP 126/87 (BP Location: Right Arm)   Pulse 80   Temp 98 F (36.7 C) (Oral)   Resp 20   SpO2 98%   Visual Acuity Right Eye Distance:   Left Eye Distance:   Bilateral Distance:    Right Eye Near:   Left Eye Near:    Bilateral Near:     Physical Exam Vitals reviewed.  Constitutional:      General: He is awake. He is not in acute distress.    Appearance: Normal appearance. He is well-developed. He is not ill-appearing, toxic-appearing or diaphoretic.  HENT:     Head: Normocephalic and atraumatic.     Right Ear: Hearing, tympanic membrane, ear canal and external ear normal.     Left Ear: Hearing, tympanic membrane, ear canal and external ear normal.      Nose: Nose normal.     Mouth/Throat:     Mouth: Mucous membranes are moist.  Eyes:     General: Vision grossly intact.     Conjunctiva/sclera: Conjunctivae normal.  Neck:     Trachea: Trachea and phonation normal.     Comments: Muscular tightness in the right lateral neck region. No significant swelling, erythema, or visible deformities are noted. Full range of motion of the neck without restriction or instability. No  other abnormalities observed on inspection or palpation.  Cardiovascular:     Rate and Rhythm: Normal rate and regular rhythm.     Heart sounds: Normal heart sounds.  Pulmonary:     Effort: Pulmonary effort is normal. No tachypnea or accessory muscle usage.     Breath sounds: Normal breath sounds and air entry. No decreased air movement. No decreased breath sounds.  Chest:     Chest wall: No swelling or tenderness.  Abdominal:     Palpations: Abdomen is soft.  Musculoskeletal:        General: Normal range of motion.     Cervical back: Full passive range of motion without pain, normal range of motion and neck supple. No rigidity or crepitus. No pain with movement, spinous process tenderness or muscular tenderness. Normal range of motion.     Right lower leg: 2+ Edema present.     Left lower leg: 2+ Edema present.  Lymphadenopathy:     Cervical: No cervical adenopathy.  Skin:    General: Skin is warm and dry.     Findings: No erythema or rash.  Neurological:     General: No focal deficit present.     Mental Status: He is alert and oriented to person, place, and time.     Sensory: Sensation is intact. No sensory deficit.     Motor: Motor function is intact. No weakness.     Coordination: Coordination is intact.     Gait: Gait is intact.  Psychiatric:        Mood and Affect: Mood and affect normal.        Speech: Speech normal.        Behavior: Behavior normal. Behavior is cooperative.      UC Treatments / Results  Labs (all labs ordered are listed, but only  abnormal results are displayed) Labs Reviewed  POCT URINALYSIS DIP (MANUAL ENTRY) - Abnormal; Notable for the following components:      Result Value   Protein Ur, POC trace (*)    All other components within normal limits  POCT FASTING CBG [facility] MANUAL ENTRY - Normal  POC SARS CORONAVIRUS 2 AG -  ED    EKG   Radiology No results found.  Procedures Procedures (including critical care time)  Medications Ordered in UC Medications - No data to display  Initial Impression / Assessment and Plan / UC Course  I have reviewed the triage vital signs and the nursing notes.  Pertinent labs & imaging results that were available during my care of the patient were reviewed by me and considered in my medical decision making (see chart for details).     Patient presents with a 3-day history of right-sided neck swelling and pressure, accompanied by fatigue, dizziness, low-grade headache, and generalized weakness. Symptoms are positional, worsening when lying down. Given the patient's history of chronic lymphatic and venous insufficiency, current symptoms may reflect an exacerbation of underlying lymphatic congestion, though other causes were considered. Physical exam reveals mild muscular tightness in the right lateral neck without significant swelling, erythema, or range of motion limitation. Neurological exam is normal, and the patient is alert, oriented, and in no acute distress. Blood glucose was normal at 87, COVID-19 test was negative, urinalysis was unremarkable, and orthostatic vitals were within normal limits. There are no focal deficits and the patient appears nontoxic. Findings do not suggest acute infection, dehydration, or systemic illness at this time. Robaxin was prescribed for muscular tightness. Patient advised to follow up  with primary care for ongoing evaluation or if new symptoms arise. Emergency department evaluation is recommended for any worsening swelling, difficulty breathing,  chest pain, persistent vomiting, or neurologic symptoms.   Final Clinical Impressions(s) / UC Diagnoses   Final diagnoses:  Dizzinesses  Other fatigue  Weakness  Chronic acquired lymphedema  Chronic venous insufficiency  Myalgia of auxiliary muscles, head and neck     Discharge Instructions      You were evaluated today for right-sided neck swelling, pressure, fatigue, dizziness, low-grade headache, and overall weakness that have been present for the past three days. Based on your history of chronic lymphatic and venous insufficiency, your symptoms may be due to an increase in lymphatic congestion, although other serious causes were considered and ruled out. Your exam showed mild muscle tightness in the right side of your neck but no significant swelling, redness, or limitation in movement. Your neurological exam was normal, and your vital signs, blood glucose, COVID-19 test, and urine tests were all within normal limits. You are alert, oriented, and stable at this time.  You were prescribed Robaxin to help relieve the muscular tightness in your neck. You may also apply warm compresses to the area and gently stretch the neck muscles as tolerated. Be sure to stay hydrated, get adequate rest, and avoid positions that worsen your symptoms.   If your symptoms persist or worsen, follow up with your primary care provider for further evaluation.   Go to the emergency room right away if you experience increasing neck swelling, difficulty breathing, chest pain, persistent vomiting, or new neurological symptoms such as vision changes, severe headache, slurred speech, or weakness on one side of the body.      ED Prescriptions     Medication Sig Dispense Auth. Provider   methocarbamol (ROBAXIN) 500 MG tablet Take 1 tablet (500 mg total) by mouth 2 (two) times daily. 14 tablet Iola Lukes, FNP      PDMP not reviewed this encounter.   Iola Lukes, [place] 02/21/24 1120

## 2024-02-24 ENCOUNTER — Ambulatory Visit: Admitting: Family Medicine

## 2024-04-27 ENCOUNTER — Telehealth: Payer: Self-pay | Admitting: Neurology

## 2024-04-27 NOTE — Telephone Encounter (Signed)
 Pt stated-on the letter need to be stated --pt had not have any form seizure since 10/2020 and following up w/ neurology once a year. Pt stated this letter is for custody for court.

## 2024-04-27 NOTE — Telephone Encounter (Signed)
 Pt. Calling again to get note for Court, that says he hasn't had Sx's. Explained 10 bus days or less, Pt asking for this to be completed today or tomorrow

## 2024-04-27 NOTE — Telephone Encounter (Signed)
LVM--to call the office back regarding letter.

## 2024-04-27 NOTE — Telephone Encounter (Signed)
 He has not been seen since 05/2023. Please check what he needs for the letter, pls ask when the last seizure was and what is this letter for. Thanks

## 2024-04-27 NOTE — Telephone Encounter (Signed)
**Note De-identified  [place] Obfuscation** Please advise 

## 2024-04-27 NOTE — Telephone Encounter (Signed)
 Left a message with the after hour service on 04-27-24 at 12:09 pm   Caller states he is returning a call about a Dr notes related to seizure.

## 2024-04-29 ENCOUNTER — Encounter: Payer: Self-pay | Admitting: Neurology

## 2024-04-29 NOTE — Telephone Encounter (Signed)
 LVM--letter is ready to be pick up at the front office. Letter placed at the front office.

## 2024-04-29 NOTE — Telephone Encounter (Signed)
Letter done, thanks.

## 2024-05-25 ENCOUNTER — Encounter: Payer: Self-pay | Admitting: Neurology

## 2024-05-25 ENCOUNTER — Ambulatory Visit: Payer: Medicaid Other | Admitting: Neurology

## 2024-05-25 VITALS — BP 115/74 | HR 80 | Ht 63.5 in | Wt 173.6 lb

## 2024-05-25 DIAGNOSIS — G40009 Localization-related (focal) (partial) idiopathic epilepsy and epileptic syndromes with seizures of localized onset, not intractable, without status epilepticus: Secondary | ICD-10-CM

## 2024-05-25 MED ORDER — OXCARBAZEPINE 600 MG PO TABS: ORAL_TABLET | ORAL | 4 refills | Status: AC

## 2024-05-25 MED ORDER — LEVETIRACETAM 500 MG PO TABS: ORAL_TABLET | ORAL | 4 refills | Status: AC
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
## 2024-05-25 NOTE — Patient Instructions (Signed)
 It's always a pleasure to see you. Wishing you all the best.  Continue Levetiracetam  500mg : take 3 tablets twice a day  2. Continue Oxcarbazepine  600mg : take 1 tablet twice a day  3. Discuss fatigue with PCP, make sure annual safety bloodwork is done  4. Follow-up in 1 year, call for any changes   Seizure Precautions: 1. If medication has been prescribed for you to prevent seizures, take it exactly as directed.  Do not stop taking the medicine without talking to your doctor first, even if you have not had a seizure in a long time.   2. Avoid activities in which a seizure would cause danger to yourself or to others.  Don't operate dangerous machinery, swim alone, or climb in high or dangerous places, such as on ladders, roofs, or girders.  Do not drive unless your doctor says you may.  3. If you have any warning that you may have a seizure, lay down in a safe place where you can't hurt yourself.    4.  No driving for 6 months from last seizure, as per [place]  state law.   Please refer to the following link on the [employer]ile[employer] of [employer]'s website for more information: http://www.epilepsyfoundation.org/answerplace/Social/driving/drivingu.cfm   5.  Maintain good sleep hygiene. Avoid alcohol  6.  Contact your doctor if you have any problems that may be related to the medicine you are taking.  7.  Call 911 and bring the patient back to the ED if:        A.  The seizure lasts longer than 5 minutes.       B.  The patient doesn't awaken shortly after the seizure  C.  The patient has new problems such as difficulty seeing, speaking or moving  D.  The patient was injured during the seizure  E.  The patient has a temperature over 102 F (39C)  F.  The patient vomited and now is having trouble breathing
